# Patient Record
Sex: Male | Born: 1960 | Race: White | Hispanic: No | Marital: Married | Smoking: Former smoker
Health system: Southern US, Community
[De-identification: ages and names within clinical notes are randomized; demographics above are authoritative.]

## PROBLEM LIST (undated history)

## (undated) DIAGNOSIS — E785 Hyperlipidemia, unspecified: Secondary | ICD-10-CM

## (undated) DIAGNOSIS — M2550 Pain in unspecified joint: Secondary | ICD-10-CM

## (undated) DIAGNOSIS — K635 Polyp of colon: Secondary | ICD-10-CM

## (undated) DIAGNOSIS — IMO0002 Reserved for concepts with insufficient information to code with codable children: Secondary | ICD-10-CM

## (undated) HISTORY — DX: Reserved for concepts with insufficient information to code with codable children: IMO0002

## (undated) HISTORY — DX: Polyp of colon: K63.5

## (undated) HISTORY — DX: Pain in unspecified joint: M25.50

## (undated) HISTORY — DX: Hyperlipidemia, unspecified: E78.5

---

## 1998-08-01 ENCOUNTER — Emergency Department (HOSPITAL_COMMUNITY): Admission: EM | Admit: 1998-08-01 | Discharge: 1998-08-01 | Payer: Self-pay | Admitting: Emergency Medicine

## 1999-06-04 ENCOUNTER — Encounter: Admission: RE | Admit: 1999-06-04 | Discharge: 1999-06-13 | Payer: Self-pay | Admitting: Neurosurgery

## 1999-06-07 ENCOUNTER — Ambulatory Visit (HOSPITAL_COMMUNITY): Admission: RE | Admit: 1999-06-07 | Discharge: 1999-06-07 | Payer: Self-pay | Admitting: Neurosurgery

## 2001-07-15 ENCOUNTER — Encounter: Admission: RE | Admit: 2001-07-15 | Discharge: 2001-10-13 | Payer: Self-pay | Admitting: Internal Medicine

## 2001-11-01 ENCOUNTER — Ambulatory Visit (HOSPITAL_COMMUNITY): Admission: RE | Admit: 2001-11-01 | Discharge: 2001-11-01 | Payer: Self-pay | Admitting: Internal Medicine

## 2001-11-01 ENCOUNTER — Encounter: Payer: Self-pay | Admitting: Internal Medicine

## 2002-01-18 ENCOUNTER — Ambulatory Visit (HOSPITAL_COMMUNITY): Admission: RE | Admit: 2002-01-18 | Discharge: 2002-01-18 | Payer: Self-pay | Admitting: Internal Medicine

## 2002-01-18 ENCOUNTER — Encounter: Payer: Self-pay | Admitting: Internal Medicine

## 2003-07-21 ENCOUNTER — Encounter: Payer: Self-pay | Admitting: Family Medicine

## 2003-07-21 ENCOUNTER — Ambulatory Visit (HOSPITAL_COMMUNITY): Admission: RE | Admit: 2003-07-21 | Discharge: 2003-07-21 | Payer: Self-pay | Admitting: Family Medicine

## 2004-08-15 ENCOUNTER — Ambulatory Visit (HOSPITAL_COMMUNITY): Admission: RE | Admit: 2004-08-15 | Discharge: 2004-08-15 | Payer: Self-pay | Admitting: Internal Medicine

## 2004-09-16 ENCOUNTER — Ambulatory Visit (HOSPITAL_COMMUNITY): Admission: RE | Admit: 2004-09-16 | Discharge: 2004-09-16 | Payer: Self-pay | Admitting: Internal Medicine

## 2004-12-14 ENCOUNTER — Emergency Department (HOSPITAL_COMMUNITY): Admission: EM | Admit: 2004-12-14 | Discharge: 2004-12-15 | Payer: Self-pay | Admitting: *Deleted

## 2005-06-18 ENCOUNTER — Ambulatory Visit (HOSPITAL_COMMUNITY): Admission: RE | Admit: 2005-06-18 | Discharge: 2005-06-18 | Payer: Self-pay | Admitting: General Surgery

## 2005-06-18 ENCOUNTER — Encounter (INDEPENDENT_AMBULATORY_CARE_PROVIDER_SITE_OTHER): Payer: Self-pay | Admitting: General Surgery

## 2009-04-18 ENCOUNTER — Encounter: Payer: Self-pay | Admitting: Cardiology

## 2009-04-20 ENCOUNTER — Encounter: Payer: Self-pay | Admitting: Cardiology

## 2009-11-27 ENCOUNTER — Encounter: Payer: Self-pay | Admitting: Cardiology

## 2010-01-31 ENCOUNTER — Encounter: Payer: Self-pay | Admitting: Cardiology

## 2010-02-12 ENCOUNTER — Ambulatory Visit (HOSPITAL_COMMUNITY): Admission: RE | Admit: 2010-02-12 | Discharge: 2010-02-12 | Payer: Self-pay | Admitting: General Surgery

## 2010-02-21 ENCOUNTER — Encounter (INDEPENDENT_AMBULATORY_CARE_PROVIDER_SITE_OTHER): Payer: Self-pay | Admitting: *Deleted

## 2010-02-21 ENCOUNTER — Telehealth (INDEPENDENT_AMBULATORY_CARE_PROVIDER_SITE_OTHER): Payer: Self-pay | Admitting: *Deleted

## 2010-02-21 ENCOUNTER — Ambulatory Visit: Payer: Self-pay | Admitting: Cardiology

## 2010-02-21 DIAGNOSIS — G894 Chronic pain syndrome: Secondary | ICD-10-CM | POA: Insufficient documentation

## 2010-02-21 DIAGNOSIS — I200 Unstable angina: Secondary | ICD-10-CM

## 2010-02-21 DIAGNOSIS — E1165 Type 2 diabetes mellitus with hyperglycemia: Secondary | ICD-10-CM

## 2010-02-21 DIAGNOSIS — IMO0001 Reserved for inherently not codable concepts without codable children: Secondary | ICD-10-CM | POA: Insufficient documentation

## 2010-02-21 DIAGNOSIS — N401 Enlarged prostate with lower urinary tract symptoms: Secondary | ICD-10-CM | POA: Insufficient documentation

## 2010-02-21 DIAGNOSIS — E119 Type 2 diabetes mellitus without complications: Secondary | ICD-10-CM | POA: Insufficient documentation

## 2010-02-21 DIAGNOSIS — E669 Obesity, unspecified: Secondary | ICD-10-CM | POA: Insufficient documentation

## 2010-02-22 ENCOUNTER — Inpatient Hospital Stay (HOSPITAL_BASED_OUTPATIENT_CLINIC_OR_DEPARTMENT_OTHER): Admission: RE | Admit: 2010-02-22 | Discharge: 2010-02-22 | Payer: Self-pay | Admitting: Cardiology

## 2010-02-22 ENCOUNTER — Ambulatory Visit: Payer: Self-pay | Admitting: Cardiology

## 2010-03-01 ENCOUNTER — Telehealth (INDEPENDENT_AMBULATORY_CARE_PROVIDER_SITE_OTHER): Payer: Self-pay | Admitting: *Deleted

## 2010-03-05 ENCOUNTER — Encounter: Payer: Self-pay | Admitting: Cardiology

## 2010-03-12 ENCOUNTER — Ambulatory Visit: Payer: Self-pay | Admitting: Cardiology

## 2011-01-07 NOTE — Letter (Signed)
Summary: Internal Other/ PATIENT HISTORY FORM  Internal Other/ PATIENT HISTORY FORM   Imported By: Dorise Hiss 02/21/2010 11:31:52  _____________________________________________________________________  External Attachment:    Type:   Image     Comment:   External Document

## 2011-01-07 NOTE — Assessment & Plan Note (Signed)
Summary: eph-post cath   Visit Type:  Follow-up Primary Provider:  Dr. Ulis Rias [facility])   History of Present Illness: The patient presents for followup of chest pain. Cardiac catheterization demonstrated normal coronary arteries. Of note he did have an abnormal chest x-ray prior to the procedure. However, a repeat that I reviewed today demonstrated no suggestion of abnormalities. It was most likely a confluence of vascular structures that was being described. Since the catheterization the patient has had no new problems. He's had no further chest pressure, neck or arm discomfort. He said no palpitations, presyncope or syncope. He has not been particularly active. He had no problems with his catheterization site.  Preventive Screening-Counseling & Management  Alcohol-Tobacco     Smoking Status: quit  Comments: smoked for about 20 yrs, quit about 3 yrs ago  Current Medications (verified): 1)  Percocet 10-325 Mg Tabs (Oxycodone-Acetaminophen) .... As Needed Pain 2)  Oxycontin 20 Mg Xr12h-Tab (Oxycodone Hcl) .... Take 1 Tablet By Mouth Three Times A Day 3)  Lisinopril 10 Mg Tabs (Lisinopril) .... Take One Tablet By Mouth Daily 4)  Lipitor 10 Mg Tabs (Atorvastatin Calcium) .... Take One Tablet By Mouth Daily. 5)  Oxycontin 40 Mg Xr12h-Tab (Oxycodone Hcl) .... Take 1 Tablet By Mouth Three Times A Day 6)  Meloxicam 15 Mg Tabs (Meloxicam) .... Take 1 Tablet By Mouth Once A Day 7)  Flomax 0.4 Mg Caps (Tamsulosin Hcl) .... Take 1 Capsule By Mouth Once A Day 8)  Metformin Hcl 850 Mg Tabs (Metformin Hcl) .... Take 1 Tablet By Mouth Two Times A Day 9)  Gabapentin 600 Mg Tabs (Gabapentin) .... Take 2 Tablets By Mouth Two Times A Day 10)  Aspirin 81 Mg Tbec (Aspirin) .... Take One Tablet By Mouth Daily  Allergies (verified): No Known Drug Allergies  Comments:  Nurse/Medical Assistant: The patient's medications were reviewed with the patient and were updated in the Medication  List. Pt did not bring list but states his list hasn't changed from last visit. Cyril Loosen, RN, BSN (March 12, 2010 11:53 AM)  Past History:  Past Medical History: Reviewed history from 02/21/2010 and no changes required. Diabetes Type 2 (for approx. 9 years now) Joint Pain Hyperlipidemia Herniated disc syndrome Colon polyp.   Review of Systems       Positive for palpitations. Otherwise as stated in the history of present illness and unchanged from previous.  Vital Signs:  Patient profile:   50 year old male Height:      72 inches Weight:      275 pounds BMI:     37.43 Pulse rate:   90 / minute BP sitting:   123 / 75  (left arm) Cuff size:   large  Vitals Entered By: Cyril Loosen, RN, BSN (March 12, 2010 11:51 AM)  Nutrition Counseling: Patient's BMI is greater than 25 and therefore counseled on weight management options. Comments Post cath follow up visit   Physical Exam  General:  Well developed, well nourished, in no acute distress. Head:  normocephalic and atraumatic Eyes:  PERRLA/EOM intact; conjunctiva and lids normal. Neck:  Neck supple, no JVD. No masses, thyromegaly or abnormal cervical nodes. Chest Wall:  no deformities or breast masses noted Lungs:  Clear bilaterally to auscultation and percussion. Heart:  Non-displaced PMI, chest non-tender; regular rate and rhythm, S1, S2 without murmurs, rubs or gallops. Carotid upstroke normal, no bruit. Normal abdominal aortic size, no bruits. Femorals normal pulses, no bruits. Pedals  normal pulses. No edema, no varicosities. Abdomen:  Bowel sounds positive; abdomen soft and non-tender without masses, organomegaly, or hernias noted. No hepatosplenomegaly. Msk:  Back normal, normal gait. Muscle strength and tone normal. Extremities:  No clubbing or cyanosis. Neurologic:  Alert and oriented x 3. Psych:  Normal affect.   Impression & Recommendations:  Problem # 1:  UNSTABLE ANGINA (ICD-411.1) The patient's chest  pain does not appear to have a cardiac etiology. No further workup is planned.  Problem # 2:  OBESITY, UNSPECIFIED (ICD-278.00) He does understand the need to lose weight with diet and exercise.  Problem # 3:  DIAB W/O MENTION COMP TYPE II/UNS TYPE UNCNTRL (ICD-250.02) This will be followed by his primary physician.  Patient Instructions: 1)  Your physician recommends that you continue on your current medications as directed. Please refer to the Current Medication list given to you today. 2)  No follow up needed.

## 2011-01-07 NOTE — Progress Notes (Signed)
Summary: NEED REPEAT CXR PA AND LATERAL  Phone Note Outgoing Call Call back at [facility] Phone [phone]   Call placed by: Carlye Grippe,  March 01, 2010 11:17 AM Call placed to: Patient Summary of Call: left message on machine to call office r/e need to get repeat PA and Lateral CXR per MD.  Initial call taken by: Carlye Grippe,  March 01, 2010 11:18 AM  Follow-up for Phone Call        Patient informed of the above.  Follow-up by: Carlye Grippe,  March 04, 2010 8:38 AM

## 2011-01-07 NOTE — Assessment & Plan Note (Signed)
Summary: NP-ANGINA -DIABETES MELLITUS TYPE II   Visit Type:  Initial Consult Primary Provider:  Dr. Ulis Rias [facility])  CC:  chest pain.  History of Present Illness: The patient has no prior cardiac history. He presents for evaluation of chest discomfort in the face of multiple cardiovascular risk factors. He did have a cardiac catheterization perhaps 10 years ago for vague symptoms. He doesn't think there was any blockage at that time. However, he now presents for evaluation of her exertional chest discomfort that is new onset. He likes to rabbit hunt.  Though he is limited by back pain e has been able to do this as recently as January without significant limitations. However, in [place] 2 separate occasionswhile doing his usual walking in the [place] developed chest pressure. It was substernal and it was associated with left arm tightness. He was short of breath. He was more diaphoretic than usual. Both episodes were the same lasting 10-15 minutesafter he stopped. Following this event he has done nothing else exerting. He has been afraid to go hunting.He has felt more fatigued however than usual. He has had no recurrence of the discomfort. He's not had any resting complaints such as PND or orthopnea.He's had no palpitations, presyncope or syncope.  Preventive Screening-Counseling & Management  Alcohol-Tobacco     Smoking Status: quit  Comments: quit smoking 3 yrs ago smoked for about 30 yrs  Current Medications (verified): 1)  Percocet 10-325 Mg Tabs (Oxycodone-Acetaminophen) .... As Needed Pain 2)  Oxycontin 20 Mg Xr12h-Tab (Oxycodone Hcl) .... Take 1 Tablet By Mouth Three Times A Day 3)  Lisinopril 10 Mg Tabs (Lisinopril) .... Take One Tablet By Mouth Daily 4)  Lipitor 10 Mg Tabs (Atorvastatin Calcium) .... Take One Tablet By Mouth Daily. 5)  Oxycontin 40 Mg Xr12h-Tab (Oxycodone Hcl) .... Take 1 Tablet By Mouth Three Times A Day 6)  Meloxicam 15 Mg Tabs (Meloxicam)  .... Take 1 Tablet By Mouth Once A Day 7)  Flomax 0.4 Mg Caps (Tamsulosin Hcl) .... Take 1 Capsule By Mouth Once A Day 8)  Metformin Hcl 850 Mg Tabs (Metformin Hcl) .... Take 1 Tablet By Mouth Two Times A Day 9)  Gabapentin 600 Mg Tabs (Gabapentin) .... Take 2 Tablets By Mouth Two Times A Day 10)  Aspirin 81 Mg Tbec (Aspirin) .... Take One Tablet By Mouth Daily  Allergies: No Known Drug Allergies  Comments:  Nurse/Medical Assistant: The patient's medications were reviewed with the patient and were updated in the Medication List. Pt brought medication bottles to office visit.  Cyril Loosen, RN, BSN (February 21, 2010 11:56 AM)  Past History:  Past Medical History: Diabetes Type 2 (for approx. 9 years now) Joint Pain Hyperlipidemia Herniated disc syndrome Colon polyp.   Past Surgical History: None  Family History: Mother had heart attack in her 3's Father no early heart disease. Brother with history of ocaine use, died age 79 of an MI Family history of diabetes  Social History: Disabled  Married, 1 stepson Rare use of alcohol Regular Exercise - no Quit tobacco 3 years ago (25 years 2 1/2 ppd at the peak) Smoking Status:  quit  Review of Systems       As stated in the HPI and negative for all other systems.   Vital Signs:  Patient profile:   50 year old male Height:      72 inches Weight:      273.25 pounds BMI:     37.19 Pulse rate:  96 / minute BP sitting:   113 / 68  (left arm) Cuff size:   large  Vitals Entered By: Cyril Loosen, RN, BSN (February 21, 2010 11:50 AM)  Nutrition Counseling: Patient's BMI is greater than 25 and therefore counseled on weight management options. CC: chest pain Comments Uncomfortable feeling in chest, family h/o CAD   Physical Exam  General:  Well developed, well nourished, in no acute distress. Head:  normocephalic and atraumatic Eyes:  PERRLA/EOM intact; conjunctiva and lids normal. Mouth:  Teeth, gums and palate  normal. Oral mucosa normal. Neck:  Neck supple, no JVD. No masses, thyromegaly or abnormal cervical nodes. Chest Wall:  no deformities or breast masses noted Lungs:  Clear bilaterally to auscultation and percussion. Abdomen:  Bowel sounds positive; abdomen soft and non-tender without masses, organomegaly, or hernias noted. No hepatosplenomegaly, obese Msk:  Back normal, normal gait. Muscle strength and tone normal. Extremities:  No clubbing or cyanosis. Neurologic:  Alert and oriented x 3. Skin:  Intact without lesions or rashes. Cervical Nodes:  no significant adenopathy Axillary Nodes:  no significant adenopathy Inguinal Nodes:  no significant adenopathy Psych:  Normal affect.   Detailed Cardiovascular Exam  Neck    Carotids: Carotids full and equal bilaterally without bruits.      Neck Veins: Normal, no JVD.    Heart    Inspection: no deformities or lifts noted.      Palpation: normal PMI with no thrills palpable.      Auscultation: regular rate and rhythm, S1, S2 without murmurs, rubs, gallops, or clicks.    Vascular    Abdominal Aorta: no palpable masses, pulsations, or audible bruits.      Femoral Pulses: normal femoral pulses bilaterally.      Pedal Pulses: normal pedal pulses bilaterally.      Radial Pulses: normal radial pulses bilaterally.      Peripheral Circulation: no clubbing, cyanosis, or edema noted with normal capillary refill.     EKG  Procedure date:  02/21/2010  Findings:      sinus rhythm with premature atrial contractions, rate 85, axis within normal limits, intervals within normal limits, no acute ST-T wave changes  Impression & Recommendations:  Problem # 1:  UNSTABLE ANGINA (ICD-411.1) The patient has new onset exertional angina which constitutes unstable angina.It is a classic description. He has multiple cardiovascular risk factors.The pretest probability of obstructive coronary disease is extremely high. Therefore, according to appropriateness  guidelinesthe most appropriate test is cardiac catheterization. I have discussed at length with the patient the rocedure. He has had it beforeabout a decade ago. We discussed risks and benefits to include stroke, death, heart attack,embolism, vascular trauma, bleeding, bruising, nfection and other. He understands and agrees to proceed.  Problem # 2:  DIAB W/O MENTION COMP TYPE II/UNS TYPE UNCNTRL (ICD-250.02) His iabetes has not been well controlled by his report. He understands the importance of followup with his physician and tight control. He says it is getting better recently.  Problem # 3:  OBESITY, UNSPECIFIED (ICD-278.00) I have discussed with the patientthe importance of weight loss and we'll reemphasize this at future visits. When I do his catheterization I will also give him specific instruction on diet and exercise knowing that he is limited by back pain.  Other Orders: EKG w/ Interpretation (93000) Cardiac Catheterization (Cardiac Cath) T-Basic Metabolic Panel ([id number]) T-CBC No Diff ([id number]) T-Protime, Auto ([id number]) T-PTT ([id number]) T-Chest x-ray, 2 views (66440)  Patient Instructions: 1)  Your physician has requested that  you have a cardiac catheterization.  Cardiac catheterization is used to diagnose and/or treat various heart conditions. Doctors may recommend this procedure for a number of different reasons. The most common reason is to evaluate chest pain. Chest pain can be a symptom of coronary artery disease (CAD), and cardiac catheterization can show whether plaque is narrowing or blocking your heart's arteries. This procedure is also used to evaluate the valves, as well as measure the blood flow and oxygen levels in different parts of your heart.  For further information please visit [email or web address]  Please follow instruction sheet, as given.  Appended Document: NP-ANGINA -DIABETES MELLITUS TYPE II faxed to [phone] PCP Ulis Rias.

## 2011-01-07 NOTE — Letter (Signed)
Summary: [facility] D/C DR. Wende Crease  [facility] D/C DR. Wende Crease   Imported By: Zachary George 02/21/2010 10:53:47  _____________________________________________________________________  External Attachment:    Type:   Image     Comment:   External Document

## 2011-01-07 NOTE — Letter (Signed)
Summary: External Correspondence/ FAXED PRE-CATH ORDER  External Correspondence/ FAXED PRE-CATH ORDER   Imported By: Dorise Hiss 02/22/2010 11:52:46  _____________________________________________________________________  External Attachment:    Type:   Image     Comment:   External Document

## 2011-01-07 NOTE — Progress Notes (Signed)
Summary: VERBAL CHEST XRAY RESULT  Phone Note From Other Clinic   Caller: Dara Call For: nurse Summary of Call: precath chest xray call report of chest xray includes slightly lobulated possible lateral contour of the cardiac sihlouette on the left but this may reflect superimposition of structures. no pulmonary edema,pneumonia, or pleural effusion. Fax result to follow. Initial call taken by: Carlye Grippe,  February 21, 2010 2:37 PM     Appended Document: VERBAL CHEST XRAY RESULT I will obtain a repeat PA and lateral CXR  Appended Document: VERBAL CHEST XRAY RESULT Patient informed of the above.order faxed to [facility].

## 2011-01-07 NOTE — Letter (Signed)
Summary: External Correspondence/ PROGRESS NOTES DR. Melvyn Neth  External Correspondence/ PROGRESS NOTES DR. Melvyn Neth   Imported By: Dorise Hiss 02/20/2010 16:19:23  _____________________________________________________________________  External Attachment:    Type:   Image     Comment:   External Document

## 2011-01-07 NOTE — Letter (Signed)
Summary: Cardiac Cath Instructions - [facility] Lab  [facility] [address]   [place], [place] [address]   Phone: [phone]  Fax: [phone]     02/21/2010 MRN: [id number]  [facility] [address] [place], [place]  [address]  Dear Mr. Beadnell,   You are scheduled for a Cardiac Catheterization on 02/22/10 with Dr. Antoine Poche.  Please arrive to the 1st floor of the Heart and Vascular Center at [facility] at 11:00am on the day of your procedure. Please do not arrive before 6:30 a.m. Call the Heart and Vascular Center at [phone] if you are unable to make your appointmnet. The Code to get into the parking garage under the building is 9000. Take the elevators to the 1st floor. You must have someone to drive you home. Someone must be with you for the first 24 hours after you arrive home. Please wear clothes that are easy to get on and off and wear slip-on shoes. Do not eat or drink after midnight except water with your medications that morning. Bring all your medications and current insurance cards with you.  _X__ Make sure you take your aspirin 325mg .  _X__ You may take ALL of your medications with water that morning.  The usual length of stay after your procedure is 2 to 3 hours. This can vary.  If you have any questions, please call the office at the number listed above.  Carlye Grippe    Directions to the [employer] [facility]  Please Note : Park in [place] under the building not the parking deck.  From [employer]: Turn onto [address] Left onto [place] [place] (1st stoplight) Right at the brick entrance to the hospital ([address] circle [address]) Bear to the right and you will see a blue sign "Heart and Vascular Center" Parking garage is a sharp right'to get through the gate out in the code 9000. Once you park, take the elevator to the first floor. Please do not arrive before 0630am. The building will be dark  before that time.   From [address] Turn onto [address] Turn left into the brick entrance to the hospital (Main circle drive) Bear to the right and you will see a blue sign "[facility] Center" Parking garage is a sharp right, to get thru the gate put in the code 9000. Once you park, take the elevator to the first floor. Please do not arrive before 0630am. The building will be dark before that time

## 2011-03-03 LAB — POCT I-STAT GLUCOSE: Glucose, Bld: 155 mg/dL — ABNORMAL HIGH (ref 70–99)

## 2011-04-25 NOTE — H&P (Signed)
NAME:  Eddie Irwin, Eddie Irwin             ACCOUNT NO.:  [id number]   MEDICAL RECORD NO.:  [id number]          PATIENT TYPE:  AMB   LOCATION:                                FACILITY:  [facility]   PHYSICIAN:  Dalia Heading, M.D.  DATE OF BIRTH:  [birth year]   DATE OF ADMISSION:  DATE OF DISCHARGE:  LH                                HISTORY & PHYSICAL   CHIEF COMPLAINT:  Hematochezia.   HISTORY OF PRESENT ILLNESS:  The patient is a 50 year old white male who is  referred for endoscopic evaluation.  He needs a colonoscopy for  hematochezia.  He has had hematochezia with nonspecific abdominal cramping  and loose stools over the past few months.  No weight loss, nausea,  vomiting, constipation, melena, had been noted.  He has never had a  colonoscopy.  There is no family history of colon carcinoma.  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   PAST MEDICAL HISTORY:  1.  Back problems.  2.  Non-insulin-dependent diabetes mellitus.   PAST SURGICAL HISTORY:  Noncontributory.   CURRENT MEDICATIONS:  1.  Avandia.  2. Glucophage.  3. Neurontin.  4. Flomax.  5. Lipitor.  6.      Glipizide.  7. OxyContin.  8. Percocet.   ALLERGIES:  CODEINE.   REVIEW OF SYSTEMS:  Noncontributory.   PHYSICAL EXAMINATION:  GENERAL:  The patient is a well-developed, well-  nourished white male in no acute distress.  VITAL SIGNS:  He is afebrile and vital signs are stable.  LUNGS:  Clear to auscultation with equal breath sounds bilaterally.  HEART:  Heart examination reveals a regular rate and rhythm without S3, S4  or murmurs.  ABDOMEN:  Soft, nontender, nondistended.  No hepatosplenomegaly or masses  are noted.  RECTAL:  Examination was deferred to the procedure.   IMPRESSION:  1.  Hematochezia.  2.  Diarrhea.  3.  Abdominal pain.   PLAN:  The patient is scheduled for a colonoscopy on June 18, 2005.  The  risks and benefits of the procedure, including bleeding and perforation were  fully explained to  the patient.  Gave informed consent.       MAJ/MEDQ  D:  06/05/2005  T:  06/05/2005  Job:  [id number]   cc:   Madelin Rear. Sherwood Gambler, MD  [address]  [place]  [place] [address]  Fax: [phone]

## 2012-07-05 ENCOUNTER — Encounter: Payer: Self-pay | Admitting: Cardiology

## 2019-12-13 ENCOUNTER — Inpatient Hospital Stay (HOSPITAL_COMMUNITY)
Admission: EM | Admit: 2019-12-13 | Discharge: 2019-12-19 | DRG: 177 | Disposition: A | Discharge status: Home / Self Care | Payer: Medicare Other | Attending: Internal Medicine | Admitting: Internal Medicine

## 2019-12-13 ENCOUNTER — Other Ambulatory Visit: Payer: Self-pay

## 2019-12-13 ENCOUNTER — Emergency Department (HOSPITAL_COMMUNITY): Payer: Medicare Other

## 2019-12-13 ENCOUNTER — Encounter (HOSPITAL_COMMUNITY): Payer: Self-pay

## 2019-12-13 DIAGNOSIS — E1110 Type 2 diabetes mellitus with ketoacidosis without coma: Secondary | ICD-10-CM

## 2019-12-13 DIAGNOSIS — I10 Essential (primary) hypertension: Secondary | ICD-10-CM

## 2019-12-13 DIAGNOSIS — Z7982 Long term (current) use of aspirin: Secondary | ICD-10-CM

## 2019-12-13 DIAGNOSIS — N179 Acute kidney failure, unspecified: Secondary | ICD-10-CM | POA: Diagnosis present

## 2019-12-13 DIAGNOSIS — U071 COVID-19: Principal | ICD-10-CM

## 2019-12-13 DIAGNOSIS — E785 Hyperlipidemia, unspecified: Secondary | ICD-10-CM | POA: Diagnosis not present

## 2019-12-13 DIAGNOSIS — Z8249 Family history of ischemic heart disease and other diseases of the circulatory system: Secondary | ICD-10-CM

## 2019-12-13 DIAGNOSIS — E669 Obesity, unspecified: Secondary | ICD-10-CM | POA: Diagnosis present

## 2019-12-13 DIAGNOSIS — Z8719 Personal history of other diseases of the digestive system: Secondary | ICD-10-CM

## 2019-12-13 DIAGNOSIS — G629 Polyneuropathy, unspecified: Secondary | ICD-10-CM | POA: Diagnosis present

## 2019-12-13 DIAGNOSIS — G894 Chronic pain syndrome: Secondary | ICD-10-CM | POA: Diagnosis present

## 2019-12-13 DIAGNOSIS — J1282 Pneumonia due to coronavirus disease 2019: Secondary | ICD-10-CM | POA: Diagnosis present

## 2019-12-13 DIAGNOSIS — Z79899 Other long term (current) drug therapy: Secondary | ICD-10-CM | POA: Diagnosis not present

## 2019-12-13 DIAGNOSIS — E0810 Diabetes mellitus due to underlying condition with ketoacidosis without coma: Secondary | ICD-10-CM

## 2019-12-13 DIAGNOSIS — R0602 Shortness of breath: Secondary | ICD-10-CM

## 2019-12-13 DIAGNOSIS — Z6841 Body Mass Index (BMI) 40.0 and over, adult: Secondary | ICD-10-CM | POA: Diagnosis not present

## 2019-12-13 DIAGNOSIS — N400 Enlarged prostate without lower urinary tract symptoms: Secondary | ICD-10-CM | POA: Diagnosis present

## 2019-12-13 DIAGNOSIS — T383X6A Underdosing of insulin and oral hypoglycemic [antidiabetic] drugs, initial encounter: Secondary | ICD-10-CM | POA: Diagnosis present

## 2019-12-13 DIAGNOSIS — J9601 Acute respiratory failure with hypoxia: Secondary | ICD-10-CM | POA: Diagnosis not present

## 2019-12-13 DIAGNOSIS — Z87891 Personal history of nicotine dependence: Secondary | ICD-10-CM

## 2019-12-13 LAB — BASIC METABOLIC PANEL
Anion gap: >20 — ABNORMAL HIGH (ref 5–15)
BUN: 31 mg/dL — ABNORMAL HIGH (ref 6–20)
CO2: 11 mmol/L — ABNORMAL LOW (ref 22–32)
Calcium: 8.4 mg/dL — ABNORMAL LOW (ref 8.9–10.3)
Chloride: 91 mmol/L — ABNORMAL LOW (ref 98–111)
Creatinine, Ser: 1.81 mg/dL — ABNORMAL HIGH (ref 0.61–1.24)
GFR calc Af Amer: 46 mL/min — ABNORMAL LOW (ref >60)
GFR calc non Af Amer: 40 mL/min — ABNORMAL LOW (ref >60)
Glucose, Bld: 320 mg/dL — ABNORMAL HIGH (ref 70–99)
Potassium: 5.1 mmol/L (ref 3.5–5.1)
Sodium: 130 mmol/L — ABNORMAL LOW (ref 135–145)

## 2019-12-13 LAB — CBC WITH DIFFERENTIAL/PLATELET
Abs Immature Granulocytes: 0.23 10*3/uL — ABNORMAL HIGH (ref 0.00–0.07)
Basophils Absolute: 0 10*3/uL (ref 0.0–0.1)
Basophils Relative: 0 %
Eosinophils Absolute: 0 10*3/uL (ref 0.0–0.5)
Eosinophils Relative: 0 %
HCT: 45.3 % (ref 39.0–52.0)
Hemoglobin: 14 g/dL (ref 13.0–17.0)
Immature Granulocytes: 3 %
Lymphocytes Relative: 9 %
Lymphs Abs: 0.7 10*3/uL (ref 0.7–4.0)
MCH: 29.9 pg (ref 26.0–34.0)
MCHC: 30.9 g/dL (ref 30.0–36.0)
MCV: 96.8 fL (ref 80.0–100.0)
Monocytes Absolute: 0.5 10*3/uL (ref 0.1–1.0)
Monocytes Relative: 7 %
Neutro Abs: 5.9 10*3/uL (ref 1.7–7.7)
Neutrophils Relative %: 81 %
Platelets: 202 10*3/uL (ref 150–400)
RBC: 4.68 MIL/uL (ref 4.22–5.81)
RDW: 12.5 % (ref 11.5–15.5)
WBC: 7.3 10*3/uL (ref 4.0–10.5)
nRBC: 0 % (ref 0.0–0.2)

## 2019-12-13 LAB — BLOOD GAS, VENOUS
Acid-base deficit: 17.4 mmol/L — ABNORMAL HIGH (ref 0.0–2.0)
Bicarbonate: 11.3 mmol/L — ABNORMAL LOW (ref 20.0–28.0)
FIO2: 96
O2 Saturation: 87.1 %
Patient temperature: 37
pCO2, Ven: 27.5 mmHg — ABNORMAL LOW (ref 44.0–60.0)
pH, Ven: 7.166 — CL (ref 7.250–7.430)
pO2, Ven: 62.9 mmHg — ABNORMAL HIGH (ref 32.0–45.0)

## 2019-12-13 LAB — TRIGLYCERIDES: Triglycerides: 404 mg/dL — ABNORMAL HIGH (ref <150)

## 2019-12-13 LAB — URINALYSIS, ROUTINE W REFLEX MICROSCOPIC
Bacteria, UA: NONE SEEN
Bilirubin Urine: NEGATIVE
Glucose, UA: >=500 mg/dL — AB
Hgb urine dipstick: NEGATIVE
Ketones, ur: 80 mg/dL — AB
Leukocytes,Ua: NEGATIVE
Nitrite: NEGATIVE
Protein, ur: NEGATIVE mg/dL
Specific Gravity, Urine: 1.017 (ref 1.005–1.030)
pH: 5 (ref 5.0–8.0)

## 2019-12-13 LAB — PROCALCITONIN: Procalcitonin: 0.11 ng/mL

## 2019-12-13 LAB — D-DIMER, QUANTITATIVE: D-Dimer, Quant: 0.89 ug/mL-FEU — ABNORMAL HIGH (ref 0.00–0.50)

## 2019-12-13 LAB — FERRITIN: Ferritin: 474 ng/mL — ABNORMAL HIGH (ref 24–336)

## 2019-12-13 LAB — CBG MONITORING, ED: Glucose-Capillary: 309 mg/dL — ABNORMAL HIGH (ref 70–99)

## 2019-12-13 LAB — LACTIC ACID, PLASMA: Lactic Acid, Venous: 2.1 mmol/L (ref 0.5–1.9)

## 2019-12-13 LAB — LACTATE DEHYDROGENASE: LDH: 263 U/L — ABNORMAL HIGH (ref 98–192)

## 2019-12-13 LAB — FIBRINOGEN: Fibrinogen: 669 mg/dL — ABNORMAL HIGH (ref 210–475)

## 2019-12-13 LAB — C-REACTIVE PROTEIN: CRP: 19.4 mg/dL — ABNORMAL HIGH (ref <1.0)

## 2019-12-13 LAB — BETA-HYDROXYBUTYRIC ACID: Beta-Hydroxybutyric Acid: 11.65 mmol/L — ABNORMAL HIGH (ref 0.05–0.27)

## 2019-12-13 MED ORDER — FENOFIBRATE 160 MG PO TABS
160.0000 mg | ORAL_TABLET | Freq: Every day | ORAL | Status: DC
  Filled 2019-12-13 (×3): qty 1

## 2019-12-13 MED ORDER — DEXTROSE-NACL 5-0.45 % IV SOLN: INTRAVENOUS | Status: DC

## 2019-12-13 MED ORDER — AEROCHAMBER Z-STAT PLUS/MEDIUM MISC
1.0000 | Freq: Once | Status: AC
  Administered 2019-12-13: 20:00:00 1

## 2019-12-13 MED ORDER — SODIUM CHLORIDE 0.9 % IV BOLUS
1000.0000 mL | INTRAVENOUS | Status: AC
  Administered 2019-12-13 (×2): 1000 mL via INTRAVENOUS

## 2019-12-13 MED ORDER — DEXTROSE 50 % IV SOLN: 0.0000 mL | INTRAVENOUS | Status: DC | PRN

## 2019-12-13 MED ORDER — SODIUM CHLORIDE 0.9 % IV BOLUS
1000.0000 mL | Freq: Once | INTRAVENOUS | Status: AC
  Administered 2019-12-13: 23:00:00 1000 mL via INTRAVENOUS

## 2019-12-13 MED ORDER — SODIUM CHLORIDE 0.9 % IV SOLN: INTRAVENOUS | Status: DC

## 2019-12-13 MED ORDER — GUAIFENESIN-DM 100-10 MG/5ML PO SYRP
10.0000 mL | ORAL_SOLUTION | ORAL | Status: DC | PRN
  Administered 2019-12-16 – 2019-12-17 (×3): 10 mL via ORAL
  Filled 2019-12-13 (×3): qty 10

## 2019-12-13 MED ORDER — TAMSULOSIN HCL 0.4 MG PO CAPS
0.4000 mg | ORAL_CAPSULE | Freq: Every day | ORAL | Status: DC
  Administered 2019-12-14 – 2019-12-19 (×6): 0.4 mg via ORAL
  Filled 2019-12-13 (×6): qty 1

## 2019-12-13 MED ORDER — POTASSIUM CHLORIDE 10 MEQ/100ML IV SOLN: 10.0000 meq | INTRAVENOUS | Status: AC

## 2019-12-13 MED ORDER — HYDROCOD POLST-CPM POLST ER 10-8 MG/5ML PO SUER
5.0000 mL | Freq: Two times a day (BID) | ORAL | Status: DC | PRN
  Administered 2019-12-14 – 2019-12-18 (×2): 5 mL via ORAL
  Filled 2019-12-13 (×2): qty 5

## 2019-12-13 MED ORDER — ALBUTEROL SULFATE HFA 108 (90 BASE) MCG/ACT IN AERS
4.0000 | INHALATION_SPRAY | Freq: Once | RESPIRATORY_TRACT | Status: AC
  Administered 2019-12-13: 20:00:00 4 via RESPIRATORY_TRACT
  Filled 2019-12-13: qty 6.7

## 2019-12-13 MED ORDER — TOPIRAMATE 25 MG PO TABS
25.0000 mg | ORAL_TABLET | Freq: Two times a day (BID) | ORAL | Status: DC
  Administered 2019-12-14 – 2019-12-19 (×11): 25 mg via ORAL
  Filled 2019-12-13 (×11): qty 1

## 2019-12-13 MED ORDER — ALBUTEROL SULFATE HFA 108 (90 BASE) MCG/ACT IN AERS
2.0000 | INHALATION_SPRAY | RESPIRATORY_TRACT | Status: DC | PRN
  Administered 2019-12-16: 08:00:00 2 via RESPIRATORY_TRACT

## 2019-12-13 MED ORDER — GABAPENTIN 300 MG PO CAPS
600.0000 mg | ORAL_CAPSULE | Freq: Two times a day (BID) | ORAL | Status: DC
  Administered 2019-12-14 – 2019-12-19 (×12): 600 mg via ORAL
  Filled 2019-12-13 (×12): qty 2

## 2019-12-13 MED ORDER — ASPIRIN EC 81 MG PO TBEC
81.0000 mg | DELAYED_RELEASE_TABLET | Freq: Every day | ORAL | Status: DC
  Administered 2019-12-14 – 2019-12-19 (×6): 81 mg via ORAL
  Filled 2019-12-13 (×6): qty 1

## 2019-12-13 MED ORDER — ZINC SULFATE 220 (50 ZN) MG PO CAPS
220.0000 mg | ORAL_CAPSULE | Freq: Every day | ORAL | Status: DC
  Administered 2019-12-14 – 2019-12-19 (×6): 220 mg via ORAL
  Filled 2019-12-13 (×6): qty 1

## 2019-12-13 MED ORDER — ATORVASTATIN CALCIUM 10 MG PO TABS
10.0000 mg | ORAL_TABLET | Freq: Every day | ORAL | Status: DC
  Administered 2019-12-14 – 2019-12-19 (×6): 10 mg via ORAL
  Filled 2019-12-13 (×6): qty 1

## 2019-12-13 MED ORDER — HEPARIN SODIUM (PORCINE) 5000 UNIT/ML IJ SOLN
5000.0000 [IU] | Freq: Three times a day (TID) | INTRAMUSCULAR | Status: DC
  Administered 2019-12-15 – 2019-12-19 (×12): 5000 [IU] via SUBCUTANEOUS
  Filled 2019-12-13 (×12): qty 1

## 2019-12-13 MED ORDER — INSULIN REGULAR(HUMAN) IN NACL 100-0.9 UT/100ML-% IV SOLN
INTRAVENOUS | Status: DC
  Filled 2019-12-13: qty 100

## 2019-12-13 MED ORDER — ASCORBIC ACID 500 MG PO TABS
500.0000 mg | ORAL_TABLET | Freq: Every day | ORAL | Status: DC
  Administered 2019-12-14 – 2019-12-19 (×6): 500 mg via ORAL
  Filled 2019-12-13 (×6): qty 1

## 2019-12-13 MED ORDER — INSULIN REGULAR(HUMAN) IN NACL 100-0.9 UT/100ML-% IV SOLN
INTRAVENOUS | Status: DC
  Administered 2019-12-13: 23:00:00 13 [IU]/h via INTRAVENOUS
  Filled 2019-12-13: qty 100

## 2019-12-13 NOTE — ED Triage Notes (Signed)
Pt lives at a family members house. family called for SOB  x 2 days.  Covid + 10 days ago.

## 2019-12-13 NOTE — ED Provider Notes (Signed)
[facility] EMERGENCY DEPARTMENT Provider Note   CSN: [id number] Arrival date & time: 12/13/19  1930     History Chief Complaint  Patient presents with  . Shortness of Breath  . Weakness  . COVID+    Eddie Irwin is a 59 y.o. male.  Patient complains of weakness and shortness of breath.  Patient has diabetes and is Covid positive  The history is provided by the patient. No language interpreter was used.  Shortness of Breath Severity:  Mild Onset quality:  Gradual Timing:  Constant Progression:  Waxing and waning Chronicity:  Recurrent Context: not activity   Relieved by:  Nothing Worsened by:  Nothing Associated symptoms: no abdominal pain, no chest pain, no cough, no headaches and no rash   Weakness Associated symptoms: shortness of breath   Associated symptoms: no abdominal pain, no chest pain, no cough, no diarrhea, no frequency, no headaches and no seizures        Past Medical History:  Diagnosis Date  . Colon polyp   . Diabetes mellitus   . Herniated disc   . Hyperlipidemia   . Joint pain     Patient Active Problem List   Diagnosis Date Noted  . COVID-19 virus infection 12/13/2019  . DKA (diabetic ketoacidoses) ([place]) 12/13/2019  . HTN (hypertension) 12/13/2019  . HLD (hyperlipidemia) 12/13/2019  . DIAB W/O MENTION COMP TYPE II/UNS TYPE UNCNTRL 02/21/2010  . Obesity, unspecified 02/21/2010  . Chronic pain syndrome 02/21/2010  . UNSTABLE ANGINA 02/21/2010  . HYPERTROPHY PROSTATE W/UR OBST & OTH LUTS 02/21/2010  . UNSPECIFIED MYALGIA AND MYOSITIS 02/21/2010    History reviewed. No pertinent surgical history.     Family History  Problem Relation Age of Onset  . Heart attack Brother 49       cocaine use  . Heart attack Mother 71    Social History   Tobacco Use  . Smoking status: Former Research scientist (life sciences)  . Smokeless tobacco: Never Used  Substance Use Topics  . Alcohol use: Yes    Comment: RARE  . Drug use: Never    Home Medications Prior to  Admission medications   Medication Sig Start Date End Date Taking? Authorizing Provider  atorvastatin (LIPITOR) 10 MG tablet Take 10 mg by mouth daily.   Yes [provider]  diclofenac (VOLTAREN) 75 MG EC tablet Take 75 mg by mouth 2 (two) times daily. 12/07/19  Yes [provider]  fenofibrate (TRICOR) 145 MG tablet Take 145 mg by mouth daily. 12/07/19  Yes [provider]  fluticasone (FLONASE) 50 MCG/ACT nasal spray Place into both nostrils daily.  12/07/19  Yes [provider]  gabapentin (NEURONTIN) 600 MG tablet Take 1,200 mg by mouth 2 (two) times daily.   Yes [provider]  HUMALOG KWIKPEN 100 UNIT/ML KwikPen  12/07/19  Yes [provider]  LANTUS SOLOSTAR 100 UNIT/ML Solostar Pen  12/07/19  Yes [provider]  lisinopril (PRINIVIL,ZESTRIL) 10 MG tablet Take 10 mg by mouth daily.   Yes [provider]  oxyCODONE (ROXICODONE) 15 MG immediate release tablet Take 15 mg by mouth every 4 (four) hours. 12/06/19  Yes [provider]  SYNJARDY 12.04-999 MG TABS Take 1 tablet by mouth 2 (two) times daily. 12/07/19  Yes [provider]  Tamsulosin HCl (FLOMAX) 0.4 MG CAPS Take 0.4 mg by mouth daily.   Yes [provider]  topiramate (TOPAMAX) 25 MG tablet Take 25 mg by mouth 2 (two) times daily. 12/07/19  Yes  [provider]  aspirin 81 MG tablet Take 81 mg by mouth daily.    [provider]    Allergies    Patient has no allergy information on record.  Review of Systems   Review of Systems  Constitutional: Negative for appetite change and fatigue.  HENT: Negative for congestion, ear discharge and sinus pressure.   Eyes: Negative for discharge.  Respiratory: Positive for shortness of breath. Negative for cough.   Cardiovascular: Negative for chest pain.  Gastrointestinal: Negative for abdominal pain and diarrhea.  Genitourinary: Negative for frequency and hematuria.   Musculoskeletal: Negative for back pain.  Skin: Negative for rash.  Neurological: Positive for weakness. Negative for seizures and headaches.  Psychiatric/Behavioral: Negative for hallucinations.    Physical Exam Updated Vital Signs BP 123/65 (BP Location: Right Arm)   Pulse (!) 114   Temp 98.6 F (37 C) (Oral)   Resp 18   Ht 6' (1.829 m)   Wt 133.8 kg   SpO2 92%   BMI 40.01 kg/m   Physical Exam Vitals and nursing note reviewed.  Constitutional:      Appearance: He is well-developed.  HENT:     Head: Normocephalic.     Nose: Nose normal.  Eyes:     General: No scleral icterus.    Conjunctiva/sclera: Conjunctivae normal.  Neck:     Thyroid: No thyromegaly.  Cardiovascular:     Rate and Rhythm: Normal rate and regular rhythm.     Heart sounds: No murmur. No friction rub. No gallop.   Pulmonary:     Breath sounds: No stridor. No wheezing or rales.  Chest:     Chest wall: No tenderness.  Abdominal:     General: There is no distension.     Tenderness: There is no abdominal tenderness. There is no rebound.  Musculoskeletal:        General: Normal range of motion.     Cervical back: Neck supple.  Lymphadenopathy:     Cervical: No cervical adenopathy.  Skin:    Findings: No erythema or rash.  Neurological:     Mental Status: He is alert and oriented to person, place, and time.     Motor: No abnormal muscle tone.     Coordination: Coordination normal.  Psychiatric:        Behavior: Behavior normal.     ED Results / Procedures / Treatments   Labs (all labs ordered are listed, but only abnormal results are displayed) Labs Reviewed  CBC WITH DIFFERENTIAL/PLATELET - Abnormal; Notable for the following components:      Result Value   Abs Immature Granulocytes 0.23 (*)    All other components within normal limits  BASIC METABOLIC PANEL - Abnormal; Notable for the following components:   Sodium 130 (*)    Chloride 91 (*)    CO2 11 (*)    Glucose, Bld 320 (*)     BUN 31 (*)    Creatinine, Ser 1.81 (*)    Calcium 8.4 (*)    GFR calc non Af Amer 40 (*)    GFR calc Af Amer 46 (*)    Anion gap >20 (*)    All other components within normal limits  BETA-HYDROXYBUTYRIC ACID  BETA-HYDROXYBUTYRIC ACID  URINALYSIS, ROUTINE W REFLEX MICROSCOPIC  BLOOD GAS, VENOUS  LACTIC ACID, PLASMA  D-DIMER, QUANTITATIVE (NOT AT [facility])  PROCALCITONIN  LACTATE DEHYDROGENASE  FERRITIN  TRIGLYCERIDES  FIBRINOGEN  C-REACTIVE PROTEIN  CBG MONITORING, ED    EKG  None  Radiology DG Chest Port 1 View  Result Date: 12/13/2019 CLINICAL DATA:  Shortness of breath for 2 days, history of COVID-19 positivity EXAM: PORTABLE CHEST 1 VIEW COMPARISON:  04/23/2019 FINDINGS: Cardiac shadow is stable. The lungs are well aerated bilaterally. Patchy infiltrates are seen bilaterally consistent with the given clinical history of COVID-19 positivity. No bony abnormality is seen. IMPRESSION: Patchy opacities consistent with the given clinical history. Electronically Signed   By: Alcide Clever M.D.   On: 12/13/2019 20:28    Procedures Procedures (including critical care time)  Medications Ordered in ED Medications  insulin regular, human (MYXREDLIN) 100 units/ 100 mL infusion (has no administration in time range)  0.9 %  sodium chloride infusion (has no administration in time range)  dextrose 5 %-0.45 % sodium chloride infusion (has no administration in time range)  dextrose 50 % solution 0-50 mL (has no administration in time range)  sodium chloride 0.9 % bolus 1,000 mL (1,000 mLs Intravenous New Bag/Given 12/13/19 2240)  albuterol (VENTOLIN HFA) 108 (90 Base) MCG/ACT inhaler 4 puff (4 puffs Inhalation Given 12/13/19 1947)  aerochamber Z-Stat Plus/medium 1 each (1 each Other Given 12/13/19 1947)  sodium chloride 0.9 % bolus 1,000 mL (1,000 mLs Intravenous New Bag/Given 12/13/19 2241)    ED Course  I have reviewed the triage vital signs and the nursing notes.  Pertinent labs & imaging  results that were available during my care of the patient were reviewed by me and considered in my medical decision making (see chart for details).    Eddie Irwin was evaluated in Emergency Department on 12/13/2019 for the symptoms described in the history of present illness. He was evaluated in the context of the global COVID-19 pandemic, which necessitated consideration that the patient might be at risk for infection with the SARS-CoV-2 virus that causes COVID-19. Institutional protocols and algorithms that pertain to the evaluation of patients at risk for COVID-19 are in a state of rapid change based on information released by regulatory bodies including the CDC and federal and state organizations. These policies and algorithms were followed during the patient's care in the ED.  MDM Rules/Calculators/A&P                      CRITICAL CARE Performed by: Bethann Berkshire Total critical care time: 35 minutes Critical care time was exclusive of separately billable procedures and treating other patients. Critical care was necessary to treat or prevent imminent or life-threatening deterioration. Critical care was time spent personally by me on the following activities: development of treatment plan with patient and/or surrogate as well as nursing, discussions with consultants, evaluation of patient's response to treatment, examination of patient, obtaining history from patient or surrogate, ordering and performing treatments and interventions, ordering and review of laboratory studies, ordering and review of radiographic studies, pulse oximetry and re-evaluation of patient's condition.  Final Clinical Impression(s) / ED Diagnoses Final diagnoses:  Diabetic ketoacidosis without coma associated with diabetes mellitus due to underlying condition [facility])  Patient with DKA and Covid infection.  Patient will be admitted to medicine  Rx / DC Orders ED Discharge Orders    None       Bethann Berkshire,  MD 12/13/19 2255

## 2019-12-13 NOTE — H&P (Addendum)
History and Physical    Patient Demographics:    Eddie Irwin:[id number] DOB: [birth year] DOA: 12/13/2019  PCP: Catha Nottingham, FNP  Patient coming from: Home  I have personally briefly reviewed patient's old medical records in [facility]  Chief Complaint: Shortness of breath   Assessment & Plan:     Assessment/Plan Principal Problem:   DKA (diabetic ketoacidoses) (HCC) Active Problems:   Obesity, unspecified   Chronic pain syndrome   COVID-19 virus infection   HTN (hypertension)   HLD (hyperlipidemia)     Principal Problem: Diabetes mellitus type 2 with diabetic ketoacidosis Patient noted to have high anion gap metabolic acidosis on presentation with hyperglycemia.  Is on Lantus, Humalog, Synjardy at home.  Patient also has associated neuropathy.  No previous A1c on record. -Patient will be admitted to progressive care unit at [facility] as [facility] is unable to accept Covid positive patients to the ICU.  Patient may be able to go off the insulin drip in the morning and be admitted to a regular floor bed with isolation at [facility] if a transfer bed is not available overnight.  -We will place on insulin drip -IV fluid resuscitation -Monitor fingersticks -Monitor BMP, beta hydroxybutyrate -Continue gabapentin  Other Active Problems: COVID-19 infection Patient is diagnosed with COVID-19 about 7 days ago.  Has been having shortness of breath over the last 2 days.  No significant hypoxemia on presentation. -We will place on zinc, ascorbic acid, cholecalciferol -Defer dexamethasone, remdesivir for now as patient has no evidence of hypoxemia and has presentation with DKA  Acute renal failure: Creatinine is 1.8 on presentation.  Baseline is unknown.  Appears to be acute renal failure.  Will give IV fluid resuscitation and monitor input, renal function closely.  Hypertension: Hold lisinopril due to AKI.  Hyperlipidemia: Continue atorvastatin, fenofibrate   Benign prostatic hypertrophy: Continue tamsulosin    DVT prophylaxis: Heparin Code Status:  Full code Family Communication: N/A  Disposition Plan: Admitted as inpatient for insulin drip, COVID-19 treatment.  Expected inpatient stay Consults called: N/A Admission status: Inpatient status    HPI:     HPI: Eddie Irwin is a 59 y.o. male with medical history significant of diabetes mellitus type 2, hypertension, hyperlipidemia, BPH who presented to the ER with shortness of breath.  Patient states he was diagnosed with COVID-19 about 7 days ago.  His wife is also tested positive.  He says over the last 2 days he has been having increased shortness of breath, weakness, fatigue, generalized myalgias.  He has not been taking his insulin due to feeling sick.  He is not also checked his blood sugars.  He is also associated nausea, dry cough.  Denies any fever, chills ED Course:  Vital Signs reviewed on presentation, significant for temperature 98.6, heart rate 109, blood pressure 123/65, saturation 92% on room air. Labs reviewed, significant for sodium 130, potassium 5.1, chloride 91, CO2 11, glucose 320, BUN 31, creatinine 1.81, WBC count 7.3, hemoglobin 14.0, hematocrit 45, platelets 202. Imaging personally Reviewed, chest x-ray shows bilateral patchy opacities consistent with COVID-19.    Review of systems:    Review of Systems: As per HPI otherwise 10 point review of systems negative.  All other review of systems is negative except the ones noted above in the HPI.    Past Medical and Surgical History:  Reviewed by me  Past Medical History:  Diagnosis Date  . Colon polyp   . Diabetes mellitus   .  Herniated disc   . Hyperlipidemia   . Joint pain     History reviewed. No pertinent surgical history.   Social History:  Reviewed by me   reports that he has quit smoking. He has never used smokeless tobacco. He reports current alcohol use. He reports that he does not use drugs.   Allergies:    Not on File  Family History :   Family History  Problem Relation Age of Onset  . Heart attack Brother 49       cocaine use  . Heart attack Mother 43   Family history reviewed, noted as above, not pertinent to current presentation.   Home Medications:    Prior to Admission medications   Medication Sig Start Date End Date Taking? Authorizing Provider  atorvastatin (LIPITOR) 10 MG tablet Take 10 mg by mouth daily.   Yes [provider]  diclofenac (VOLTAREN) 75 MG EC tablet Take 75 mg by mouth 2 (two) times daily. 12/07/19  Yes [provider]  fenofibrate (TRICOR) 145 MG tablet Take 145 mg by mouth daily. 12/07/19  Yes [provider]  fluticasone (FLONASE) 50 MCG/ACT nasal spray Place into both nostrils daily.  12/07/19  Yes [provider]  gabapentin (NEURONTIN) 600 MG tablet Take 1,200 mg by mouth 2 (two) times daily.   Yes [provider]  HUMALOG KWIKPEN 100 UNIT/ML KwikPen  12/07/19  Yes [provider]  LANTUS SOLOSTAR 100 UNIT/ML Solostar Pen  12/07/19  Yes [provider]  lisinopril (PRINIVIL,ZESTRIL) 10 MG tablet Take 10 mg by mouth daily.   Yes [provider]  oxyCODONE (ROXICODONE) 15 MG immediate release tablet Take 15 mg by mouth every 4 (four) hours. 12/06/19  Yes [provider]  SYNJARDY 12.04-999 MG TABS Take 1 tablet by mouth 2 (two) times daily. 12/07/19  Yes [provider]  Tamsulosin HCl (FLOMAX) 0.4 MG CAPS Take 0.4 mg by mouth daily.   Yes [provider]  topiramate (TOPAMAX) 25 MG tablet Take 25 mg by mouth 2 (two) times daily. 12/07/19  Yes [provider]  aspirin 81 MG tablet Take 81 mg by mouth daily.    [provider]    Physical Exam:    Physical Exam: Vitals:   12/13/19 1936 12/13/19 2030  BP: 123/65   Pulse: (!) 101 (!) 114  Resp: 18   Temp: 98.6 F (37 C)   TempSrc: Oral   SpO2: 94% 92%  Weight: 133.8  kg   Height: 6' (1.829 m)     Constitutional: NAD, calm, comfortable Vitals:   12/13/19 1936 12/13/19 2030  BP: 123/65   Pulse: (!) 101 (!) 114  Resp: 18   Temp: 98.6 F (37 C)   TempSrc: Oral   SpO2: 94% 92%  Weight: 133.8 kg   Height: 6' (1.829 m)    Eyes: PERRL, lids and conjunctivae normal ENMT: Mucous membranes are moist. Posterior pharynx clear of any exudate or lesions.Normal dentition.  Neck: normal, supple, no masses, no thyromegaly Respiratory: clear to auscultation bilaterally, no wheezing, mild bilateral crepitations. Normal respiratory effort. No accessory muscle use.  Cardiovascular: Regular rate and rhythm, no murmurs / rubs / gallops. No extremity edema. 2+ pedal pulses. No carotid bruits.  Abdomen: no tenderness, no masses palpated. No hepatosplenomegaly. Bowel sounds positive.  Musculoskeletal: no clubbing / cyanosis. No joint deformity upper and lower extremities. Good ROM, no contractures. Normal muscle tone.  Skin: no rashes, lesions, ulcers. No induration Neurologic: CN 2-12  grossly intact. Sensation intact, DTR normal. Strength 5/5 in all 4.  Psychiatric: Normal judgment and insight. Alert and oriented x 3. Normal mood.    Decubitus Ulcers: Not present on admission Catheters and tubes: None  Data Review:    Labs on Admission: I have personally reviewed following labs and imaging studies  CBC: Recent Labs  Lab 12/13/19 2120  WBC 7.3  NEUTROABS 5.9  HGB 14.0  HCT 45.3  MCV 96.8  PLT 101   Basic Metabolic Panel: Recent Labs  Lab 12/13/19 2120  NA 130*  K 5.1  CL 91*  CO2 11*  GLUCOSE 320*  BUN 31*  CREATININE 1.81*  CALCIUM 8.4*   GFR: Estimated Creatinine Clearance: 62.2 mL/min (A) (by C-G formula based on SCr of 1.81 mg/dL (H)). Liver Function Tests: No results for input(s): AST, ALT, ALKPHOS, BILITOT, PROT, ALBUMIN in the last 168 hours. No results for input(s): LIPASE, AMYLASE in the last 168 hours. No results for input(s):  AMMONIA in the last 168 hours. Coagulation Profile: No results for input(s): INR, PROTIME in the last 168 hours. Cardiac Enzymes: No results for input(s): CKTOTAL, CKMB, CKMBINDEX, TROPONINI in the last 168 hours. BNP (last 3 results) No results for input(s): PROBNP in the last 8760 hours. HbA1C: No results for input(s): HGBA1C in the last 72 hours. CBG: No results for input(s): GLUCAP in the last 168 hours. Lipid Profile: No results for input(s): CHOL, HDL, LDLCALC, TRIG, CHOLHDL, LDLDIRECT in the last 72 hours. Thyroid Function Tests: No results for input(s): TSH, T4TOTAL, FREET4, T3FREE, THYROIDAB in the last 72 hours. Anemia Panel: No results for input(s): VITAMINB12, FOLATE, FERRITIN, TIBC, IRON, RETICCTPCT in the last 72 hours. Urine analysis: No results found for: COLORURINE, APPEARANCEUR, [place], [place], [place], [place], BILIRUBINUR, KETONESUR, PROTEINUR, UROBILINOGEN, NITRITE, LEUKOCYTESUR   Imaging Results:      Radiological Exams on Admission: DG Chest Port 1 View  Result Date: 12/13/2019 CLINICAL DATA:  Shortness of breath for 2 days, history of COVID-19 positivity EXAM: PORTABLE CHEST 1 VIEW COMPARISON:  04/23/2019 FINDINGS: Cardiac shadow is stable. The lungs are well aerated bilaterally. Patchy infiltrates are seen bilaterally consistent with the given clinical history of COVID-19 positivity. No bony abnormality is seen. IMPRESSION: Patchy opacities consistent with the given clinical history. Electronically Signed   By: Inez Catalina M.D.   On: 12/13/2019 20:28      [facility] Ginette Otto MD [facility]  If 7PM-7AM, please contact night-coverage   12/13/2019, 10:54 PM

## 2019-12-14 DIAGNOSIS — I10 Essential (primary) hypertension: Secondary | ICD-10-CM

## 2019-12-14 DIAGNOSIS — U071 COVID-19: Principal | ICD-10-CM

## 2019-12-14 DIAGNOSIS — E785 Hyperlipidemia, unspecified: Secondary | ICD-10-CM

## 2019-12-14 DIAGNOSIS — J9601 Acute respiratory failure with hypoxia: Secondary | ICD-10-CM

## 2019-12-14 LAB — BASIC METABOLIC PANEL
Anion gap: 18 — ABNORMAL HIGH (ref 5–15)
Anion gap: 17 — ABNORMAL HIGH (ref 5–15)
Anion gap: 16 — ABNORMAL HIGH (ref 5–15)
Anion gap: 10 (ref 5–15)
Anion gap: 14 (ref 5–15)
BUN: 29 mg/dL — ABNORMAL HIGH (ref 6–20)
BUN: 28 mg/dL — ABNORMAL HIGH (ref 6–20)
BUN: 26 mg/dL — ABNORMAL HIGH (ref 6–20)
BUN: 24 mg/dL — ABNORMAL HIGH (ref 6–20)
BUN: 24 mg/dL — ABNORMAL HIGH (ref 6–20)
CO2: 13 mmol/L — ABNORMAL LOW (ref 22–32)
CO2: 17 mmol/L — ABNORMAL LOW (ref 22–32)
CO2: 18 mmol/L — ABNORMAL LOW (ref 22–32)
CO2: 20 mmol/L — ABNORMAL LOW (ref 22–32)
CO2: 18 mmol/L — ABNORMAL LOW (ref 22–32)
Calcium: 7.3 mg/dL — ABNORMAL LOW (ref 8.9–10.3)
Calcium: 7.9 mg/dL — ABNORMAL LOW (ref 8.9–10.3)
Calcium: 8.1 mg/dL — ABNORMAL LOW (ref 8.9–10.3)
Calcium: 7.8 mg/dL — ABNORMAL LOW (ref 8.9–10.3)
Calcium: 8.1 mg/dL — ABNORMAL LOW (ref 8.9–10.3)
Chloride: 100 mmol/L (ref 98–111)
Chloride: 98 mmol/L (ref 98–111)
Chloride: 98 mmol/L (ref 98–111)
Chloride: 100 mmol/L (ref 98–111)
Chloride: 101 mmol/L (ref 98–111)
Creatinine, Ser: 1.51 mg/dL — ABNORMAL HIGH (ref 0.61–1.24)
Creatinine, Ser: 1.45 mg/dL — ABNORMAL HIGH (ref 0.61–1.24)
Creatinine, Ser: 1.34 mg/dL — ABNORMAL HIGH (ref 0.61–1.24)
Creatinine, Ser: 1.21 mg/dL (ref 0.61–1.24)
Creatinine, Ser: 1.25 mg/dL — ABNORMAL HIGH (ref 0.61–1.24)
GFR calc Af Amer: 58 mL/min — ABNORMAL LOW (ref >60)
GFR calc Af Amer: >60 mL/min (ref >60)
GFR calc Af Amer: >60 mL/min (ref >60)
GFR calc Af Amer: >60 mL/min (ref >60)
GFR calc Af Amer: >60 mL/min (ref >60)
GFR calc non Af Amer: 50 mL/min — ABNORMAL LOW (ref >60)
GFR calc non Af Amer: 52 mL/min — ABNORMAL LOW (ref >60)
GFR calc non Af Amer: 58 mL/min — ABNORMAL LOW (ref >60)
GFR calc non Af Amer: >60 mL/min (ref >60)
GFR calc non Af Amer: >60 mL/min (ref >60)
Glucose, Bld: 171 mg/dL — ABNORMAL HIGH (ref 70–99)
Glucose, Bld: 173 mg/dL — ABNORMAL HIGH (ref 70–99)
Glucose, Bld: 168 mg/dL — ABNORMAL HIGH (ref 70–99)
Glucose, Bld: 146 mg/dL — ABNORMAL HIGH (ref 70–99)
Glucose, Bld: 258 mg/dL — ABNORMAL HIGH (ref 70–99)
Potassium: 4.8 mmol/L (ref 3.5–5.1)
Potassium: 5 mmol/L (ref 3.5–5.1)
Potassium: 4.9 mmol/L (ref 3.5–5.1)
Potassium: 5.2 mmol/L — ABNORMAL HIGH (ref 3.5–5.1)
Potassium: 5.3 mmol/L — ABNORMAL HIGH (ref 3.5–5.1)
Sodium: 131 mmol/L — ABNORMAL LOW (ref 135–145)
Sodium: 132 mmol/L — ABNORMAL LOW (ref 135–145)
Sodium: 132 mmol/L — ABNORMAL LOW (ref 135–145)
Sodium: 130 mmol/L — ABNORMAL LOW (ref 135–145)
Sodium: 133 mmol/L — ABNORMAL LOW (ref 135–145)

## 2019-12-14 LAB — CBG MONITORING, ED
Glucose-Capillary: 129 mg/dL — ABNORMAL HIGH (ref 70–99)
Glucose-Capillary: 133 mg/dL — ABNORMAL HIGH (ref 70–99)
Glucose-Capillary: 151 mg/dL — ABNORMAL HIGH (ref 70–99)
Glucose-Capillary: 160 mg/dL — ABNORMAL HIGH (ref 70–99)
Glucose-Capillary: 161 mg/dL — ABNORMAL HIGH (ref 70–99)
Glucose-Capillary: 164 mg/dL — ABNORMAL HIGH (ref 70–99)
Glucose-Capillary: 165 mg/dL — ABNORMAL HIGH (ref 70–99)
Glucose-Capillary: 174 mg/dL — ABNORMAL HIGH (ref 70–99)
Glucose-Capillary: 184 mg/dL — ABNORMAL HIGH (ref 70–99)
Glucose-Capillary: 205 mg/dL — ABNORMAL HIGH (ref 70–99)
Glucose-Capillary: 206 mg/dL — ABNORMAL HIGH (ref 70–99)
Glucose-Capillary: 221 mg/dL — ABNORMAL HIGH (ref 70–99)
Glucose-Capillary: 249 mg/dL — ABNORMAL HIGH (ref 70–99)
Glucose-Capillary: 266 mg/dL — ABNORMAL HIGH (ref 70–99)

## 2019-12-14 LAB — C-REACTIVE PROTEIN: CRP: 19.2 mg/dL — ABNORMAL HIGH (ref <1.0)

## 2019-12-14 LAB — LIPID PANEL
Cholesterol: 145 mg/dL (ref 0–200)
HDL: 30 mg/dL — ABNORMAL LOW (ref >40)
LDL Cholesterol: 65 mg/dL (ref 0–99)
Total CHOL/HDL Ratio: 4.8 RATIO
Triglycerides: 250 mg/dL — ABNORMAL HIGH (ref <150)
VLDL: 50 mg/dL — ABNORMAL HIGH (ref 0–40)

## 2019-12-14 LAB — RESPIRATORY PANEL BY RT PCR (FLU A&B, COVID)
Influenza A by PCR: NEGATIVE
Influenza B by PCR: NEGATIVE
SARS Coronavirus 2 by RT PCR: POSITIVE — AB

## 2019-12-14 LAB — HEMOGLOBIN A1C
Hgb A1c MFr Bld: 10.5 % — ABNORMAL HIGH (ref 4.8–5.6)
Mean Plasma Glucose: 254.65 mg/dL

## 2019-12-14 LAB — GLUCOSE, CAPILLARY: Glucose-Capillary: 182 mg/dL — ABNORMAL HIGH (ref 70–99)

## 2019-12-14 LAB — BETA-HYDROXYBUTYRIC ACID
Beta-Hydroxybutyric Acid: 5.36 mmol/L — ABNORMAL HIGH (ref 0.05–0.27)
Beta-Hydroxybutyric Acid: 2.83 mmol/L — ABNORMAL HIGH (ref 0.05–0.27)

## 2019-12-14 LAB — HIV ANTIBODY (ROUTINE TESTING W REFLEX): HIV Screen 4th Generation wRfx: NONREACTIVE

## 2019-12-14 LAB — FERRITIN: Ferritin: 557 ng/mL — ABNORMAL HIGH (ref 24–336)

## 2019-12-14 LAB — D-DIMER, QUANTITATIVE: D-Dimer, Quant: 1.17 ug/mL-FEU — ABNORMAL HIGH (ref 0.00–0.50)

## 2019-12-14 LAB — ABO/RH: ABO/RH(D): O POS

## 2019-12-14 MED ORDER — VITAMIN D3 25 MCG PO TABS
1000.0000 [IU] | ORAL_TABLET | Freq: Every day | ORAL | Status: DC
  Administered 2019-12-14 – 2019-12-19 (×6): 1000 [IU] via ORAL
  Filled 2019-12-14 (×13): qty 1

## 2019-12-14 MED ORDER — OXYCODONE HCL 5 MG PO TABS
15.0000 mg | ORAL_TABLET | Freq: Four times a day (QID) | ORAL | Status: DC | PRN
  Administered 2019-12-14 – 2019-12-19 (×16): 15 mg via ORAL
  Filled 2019-12-14 (×16): qty 3

## 2019-12-14 MED ORDER — INSULIN ASPART 100 UNIT/ML ~~LOC~~ SOLN
0.0000 [IU] | Freq: Three times a day (TID) | SUBCUTANEOUS | Status: DC
  Administered 2019-12-15: 09:00:00 3 [IU] via SUBCUTANEOUS
  Administered 2019-12-15 (×2): 5 [IU] via SUBCUTANEOUS
  Administered 2019-12-16: 08:00:00 2 [IU] via SUBCUTANEOUS
  Administered 2019-12-16: 3 [IU] via SUBCUTANEOUS
  Administered 2019-12-16: 8 [IU] via SUBCUTANEOUS
  Administered 2019-12-17: 12:00:00 3 [IU] via SUBCUTANEOUS
  Administered 2019-12-17: 2 [IU] via SUBCUTANEOUS
  Administered 2019-12-17: 18:00:00 5 [IU] via SUBCUTANEOUS
  Administered 2019-12-18 (×2): 8 [IU] via SUBCUTANEOUS
  Administered 2019-12-18: 09:00:00 3 [IU] via SUBCUTANEOUS
  Administered 2019-12-19: 09:00:00 5 [IU] via SUBCUTANEOUS
  Administered 2019-12-19: 11 [IU] via SUBCUTANEOUS
  Administered 2019-12-19: 16:00:00 15 [IU] via SUBCUTANEOUS

## 2019-12-14 MED ORDER — INSULIN ASPART 100 UNIT/ML ~~LOC~~ SOLN
0.0000 [IU] | Freq: Every day | SUBCUTANEOUS | Status: DC
  Administered 2019-12-15: 22:00:00 3 [IU] via SUBCUTANEOUS
  Administered 2019-12-16 – 2019-12-17 (×2): 2 [IU] via SUBCUTANEOUS
  Administered 2019-12-18: 4 [IU] via SUBCUTANEOUS

## 2019-12-14 MED ORDER — DEXAMETHASONE SODIUM PHOSPHATE 10 MG/ML IJ SOLN
6.0000 mg | INTRAMUSCULAR | Status: DC
  Administered 2019-12-14 – 2019-12-19 (×6): 6 mg via INTRAVENOUS
  Filled 2019-12-14: qty 1
  Filled 2019-12-14 (×2): qty 0.6
  Filled 2019-12-14 (×5): qty 1

## 2019-12-14 MED ORDER — CHLORHEXIDINE GLUCONATE CLOTH 2 % EX PADS
6.0000 | MEDICATED_PAD | Freq: Every day | CUTANEOUS | Status: DC
  Administered 2019-12-14 – 2019-12-19 (×6): 6 via TOPICAL

## 2019-12-14 MED ORDER — INSULIN GLARGINE 100 UNIT/ML ~~LOC~~ SOLN
40.0000 [IU] | Freq: Every day | SUBCUTANEOUS | Status: DC
  Administered 2019-12-14 – 2019-12-19 (×6): 40 [IU] via SUBCUTANEOUS
  Filled 2019-12-14 (×7): qty 0.4

## 2019-12-14 MED ORDER — FENOFIBRATE 160 MG PO TABS
160.0000 mg | ORAL_TABLET | Freq: Every day | ORAL | Status: DC
  Administered 2019-12-15 – 2019-12-18 (×4): 160 mg via ORAL
  Filled 2019-12-14 (×6): qty 1

## 2019-12-14 MED ORDER — SODIUM CHLORIDE 0.9 % IV SOLN
200.0000 mg | Freq: Once | INTRAVENOUS | Status: AC
  Administered 2019-12-14: 200 mg via INTRAVENOUS
  Filled 2019-12-14: qty 40

## 2019-12-14 MED ORDER — SODIUM ZIRCONIUM CYCLOSILICATE 10 G PO PACK: 10.0000 g | PACK | Freq: Once | ORAL | Status: DC

## 2019-12-14 MED ORDER — ORAL CARE MOUTH RINSE
15.0000 mL | Freq: Two times a day (BID) | OROMUCOSAL | Status: DC
  Administered 2019-12-14 – 2019-12-19 (×10): 15 mL via OROMUCOSAL

## 2019-12-14 MED ORDER — SODIUM CHLORIDE 0.9 % IV SOLN
100.0000 mg | Freq: Every day | INTRAVENOUS | Status: AC
  Administered 2019-12-15 – 2019-12-18 (×4): 100 mg via INTRAVENOUS
  Filled 2019-12-14 (×2): qty 20
  Filled 2019-12-14 (×2): qty 100

## 2019-12-14 NOTE — ED Notes (Signed)
Pt given sandwich 

## 2019-12-14 NOTE — ED Notes (Signed)
Report given to Heather RN in ICU 

## 2019-12-14 NOTE — Progress Notes (Signed)
Inpatient Diabetes Program Recommendations  AACE/ADA: New Consensus Statement on Inpatient Glycemic Control   Target Ranges:  Prepandial:   less than 140 mg/dL      Peak postprandial:   less than 180 mg/dL (1-2 hours)      Critically ill patients:  140 - 180 mg/dL   Results for Eddie Irwin, Eddie Irwin (MRN [id number]) as of 12/14/2019 10:43  Ref. Range 12/14/2019 00:12 12/14/2019 01:08 12/14/2019 02:20 12/14/2019 03:14 12/14/2019 04:19 12/14/2019 05:26 12/14/2019 06:20 12/14/2019 07:55  Glucose-Capillary Latest Ref Range: 70 - 99 mg/dL 366 (H) 440 (H) 347 (H) 151 (H) 133 (H) 160 (H) 164 (H) 174 (H)  Results for Eddie Irwin, Eddie Irwin (MRN [id number]) as of 12/14/2019 10:43  Ref. Range 12/14/2019 08:03  Beta-Hydroxybutyric Acid Latest Ref Range: 0.05 - 0.27 mmol/L 5.36 (H)   Results for Eddie Irwin, Eddie Irwin (MRN [id number]) as of 12/14/2019 10:43  Ref. Range 12/13/2019 21:20  Beta-Hydroxybutyric Acid Latest Ref Range: 0.05 - 0.27 mmol/L 11.65 (H)  Glucose Latest Ref Range: 70 - 99 mg/dL 884 (H)  Hemoglobin Z6S Latest Ref Range: 4.8 - 5.6 % 10.5 (H)   Review of Glycemic Control  Diabetes history: DM2 Outpatient Diabetes medications: Lantus 75 units QHS, Humalog 50 units TID with meals, Synjardy 12.04-999 mg BID Current orders for Inpatient glycemic control: IV insulin per DKA  Inpatient Diabetes Program Recommendations:   IV to SQ insulin at transition: Once acidosis is cleared as determined by MD, please consider ordering Lantus 40 units Q24H (based on 133 kg x 0.3 units), CBGs Q4H, and Novolog 0-15 units Q4H.  NOTE: Patient is currently in Emergency Department at [facility] on IV insulin for DKA and also COVID+.  Spoke with patient over the phone about diabetes and home regimen for diabetes control. Patient reports being followed by PCP for diabetes management and currently prescribed Lantus 75 units QHS, Humalog 50 units TID with meals, and Synjardy 12.04-999 mg BID for diabetes control. Patient admits that he does  not take DM medications consistently.  Patient reports that his glucose is usually in the 170's-200's mg/dl when he takes his DM medications as prescribed.  Inquired about prior A1C and patient reports not being able to recall last A1C value. Discussed A1C results (10.5% on 12/13/19) and explained that current A1C indicates an average glucose of 255 mg/dl over the past 2-3 months. Discussed glucose and A1C goals. Discussed importance of checking CBGs and maintaining good CBG control to prevent long-term and short-term complications. Explained how hyperglycemia leads to damage within blood vessels which lead to the common complications seen with uncontrolled diabetes. Stressed to the patient the importance of improving glycemic control to prevent further complications from uncontrolled diabetes. Discussed impact of nutrition, exercise, stress, sickness, and medications on diabetes control.  Patient reports that he has never been in DKA. Discussed basic pathophysiology of DKA and explained that labs would be continued to be drawn to ensure acidosis is cleared before he is transitioned off IV insulin. Encouraged patient to start taking DM medications as prescribed consistently to decrease risk of further complications of uncontrolled DM. Encouraged patient to follow up with PCP regarding improving DM control.  Patient verbalized understanding of information discussed and reports no further questions at this time related to diabetes.  Thanks, Orlando Penner, RN, MSN, CDE Diabetes Coordinator Inpatient Diabetes Program [phone] (Team Pager)

## 2019-12-14 NOTE — Progress Notes (Signed)
PROGRESS NOTE    Eddie Irwin  NTZ:[id number] DOB: 1961-07-12 DOA: 12/13/2019 PCP: Catha Nottingham, FNP    Brief Narrative:  HPI: Eddie Irwin is a 59 y.o. male with medical history significant of diabetes mellitus type 2, hypertension, hyperlipidemia, BPH who presented to the ER with shortness of breath.  Patient states he was diagnosed with COVID-19 about 7 days ago.  His wife is also tested positive.  He says over the last 2 days he has been having increased shortness of breath, weakness, fatigue, generalized myalgias.  He has not been taking his insulin due to feeling sick.  He is not also checked his blood sugars.  He is also associated nausea, dry cough.  Denies any fever, chills ED Course:  Vital Signs reviewed on presentation, significant for temperature 98.6, heart rate 109, blood pressure 123/65, saturation 92% on room air. Labs reviewed, significant for sodium 130, potassium 5.1, chloride 91, CO2 11, glucose 320, BUN 31, creatinine 1.81, WBC count 7.3, hemoglobin 14.0, hematocrit 45, platelets 202. Imaging personally Reviewed, chest x-ray shows bilateral patchy opacities consistent with COVID-19.   Assessment & Plan:   Principal Problem:   DKA (diabetic ketoacidoses) (HCC) Active Problems:   Obesity, unspecified   Chronic pain syndrome   COVID-19 virus infection   HTN (hypertension)   HLD (hyperlipidemia)   1. Diabetic ketoacidosis and type II diabetic.  Patient has uncontrolled diabetes with hyperglycemia.  He is chronically on Lantus, Humalog, Synjardy at home.  He reports compliance with medications, but since contracting COVID-19 reports that he has been having trouble thinking clearly and may have forgotten to take his medications.  He has been started on insulin infusion and IV fluids.  Overall anion gap is improving.  Will transition to Lantus based on diabetic coordinator recommendations. 2. Acute respiratory failure with hypoxia secondary to COVID-19 pneumonia.   Patient noted to have chest x-ray findings consistent with COVID-19 infection.  He is currently requiring 4 L of oxygen.  Breathing appears to be nonlabored.  He has been started on remdesivir and dexamethasone.  Continue to titrate oxygen as tolerated. 3. Acute renal failure.  Creatinine 1.8 on presentation.  This is improved to the normal range with IV hydration.  Continue to follow urine output.  Hold ACE inhibitor. 4. Hypertension.  Blood pressure currently stable.  Holding ACE inhibitor. 5. Hyperlipidemia.  Continue Lipitor 6. BPH.  Continue on tamsulosin   DVT prophylaxis: Heparin Code Status: Full code Family Communication: Discussed with patient Disposition Plan: Discharge home once DKA has resolved and respiratory status has stabilized.   Consultants:     Procedures:     Antimicrobials:   Remdesivir 1/6>   Subjective: Overall shortness of breath improving.  Has a nonproductive cough.  Still on insulin infusion.  Objective: Vitals:   12/14/19 1500 12/14/19 1530 12/14/19 1630 12/14/19 1700  BP: 123/63 (!) 127/57 106/62 (!) 98/52  Pulse: 85 81 79 85  Resp: (!) 27 (!) 27 (!) 30 (!) 30  Temp:      TempSrc:      SpO2: 95% 95% 94% 94%  Weight:      Height:        Intake/Output Summary (Last 24 hours) at 12/14/2019 1854 Last data filed at 12/14/2019 1302 Gross per 24 hour  Intake 1201.59 ml  Output --  Net 1201.59 ml   Filed Weights   12/13/19 1936  Weight: 133.8 kg    Examination:  General exam: Appears calm and comfortable  Respiratory system: Clear to auscultation. Respiratory effort normal. Cardiovascular system: S1 & S2 heard, RRR. No JVD, murmurs, rubs, gallops or clicks. No pedal edema. Gastrointestinal system: Abdomen is nondistended, soft and nontender. No organomegaly or masses felt. Normal bowel sounds heard. Central nervous system: Alert and oriented. No focal neurological deficits. Extremities: Symmetric 5 x 5 power. Skin: No rashes, lesions or  ulcers Psychiatry: Judgement and insight appear normal. Mood & affect appropriate.     Data Reviewed: I have personally reviewed following labs and imaging studies  CBC: Recent Labs  Lab 12/13/19 2120  WBC 7.3  NEUTROABS 5.9  HGB 14.0  HCT 45.3  MCV 96.8  PLT 202   Basic Metabolic Panel: Recent Labs  Lab 12/13/19 2120 12/14/19 0154 12/14/19 0804 12/14/19 1136 12/14/19 1615  NA 130* 131* 132* 132* 130*  K 5.1 4.8 5.0 4.9 5.2*  CL 91* 100 98 98 100  CO2 11* 13* 17* 18* 20*  GLUCOSE 320* 171* 173* 168* 146*  BUN 31* 29* 28* 26* 24*  CREATININE 1.81* 1.51* 1.45* 1.34* 1.21  CALCIUM 8.4* 7.3* 7.9* 8.1* 7.8*   GFR: Estimated Creatinine Clearance: 93.1 mL/min (by C-G formula based on SCr of 1.21 mg/dL). Liver Function Tests: No results for input(s): AST, ALT, ALKPHOS, BILITOT, PROT, ALBUMIN in the last 168 hours. No results for input(s): LIPASE, AMYLASE in the last 168 hours. No results for input(s): AMMONIA in the last 168 hours. Coagulation Profile: No results for input(s): INR, PROTIME in the last 168 hours. Cardiac Enzymes: No results for input(s): CKTOTAL, CKMB, CKMBINDEX, TROPONINI in the last 168 hours. BNP (last 3 results) No results for input(s): PROBNP in the last 8760 hours. HbA1C: Recent Labs    12/13/19 2120  HGBA1C 10.5*   CBG: Recent Labs  Lab 12/14/19 0526 12/14/19 0620 12/14/19 0755 12/14/19 0954 12/14/19 1200  GLUCAP 160* 164* 174* 161* 165*   Lipid Profile: Recent Labs    12/13/19 2120 12/14/19 0154  CHOL  --  145  HDL  --  30*  LDLCALC  --  65  TRIG 404* 250*  CHOLHDL  --  4.8   Thyroid Function Tests: No results for input(s): TSH, T4TOTAL, FREET4, T3FREE, THYROIDAB in the last 72 hours. Anemia Panel: Recent Labs    12/13/19 2120 12/14/19 1136  FERRITIN 474* 557*   Sepsis Labs: Recent Labs  Lab 12/13/19 2120  PROCALCITON 0.11  LATICACIDVEN 2.1*    Recent Results (from the past 240 hour(s))  Respiratory Panel by  RT PCR (Flu A&B, Covid) - Nasopharyngeal Swab     Status: Abnormal   Collection Time: 12/13/19 11:49 PM   Specimen: Nasopharyngeal Swab  Result Value Ref Range Status   SARS Coronavirus 2 by RT PCR POSITIVE (A) NEGATIVE Final    Comment: RESULT CALLED TO, READ BACK BY AND VERIFIED WITH: WALKER,T. AT 0102 ON 12/14/2019 BY EVA (NOTE) SARS-CoV-2 target nucleic acids are DETECTED. SARS-CoV-2 RNA is generally detectable in upper respiratory specimens  during the acute phase of infection. Positive results are indicative of the presence of the identified virus, but do not rule out bacterial infection or co-infection with other pathogens not detected by the test. Clinical correlation with patient history and other diagnostic information is necessary to determine patient infection status. The expected result is Negative. Fact Sheet for Patients:  https://www.moore.com/ Fact Sheet for Healthcare Providers: https://www.young.biz/ This test is not yet approved or cleared by the [place] FDA and  has been authorized for detection and/or diagnosis  of SARS-CoV-2 by FDA under an Emergency Use Authorization (EUA).  This EUA will remain in effect (meaning this test can be used ) for the duration of  the COVID-19 declaration under Section 564(b)(1) of the Act, 21 U.S.C. section 360bbb-3(b)(1), unless the authorization is terminated or revoked sooner.    Influenza A by PCR NEGATIVE NEGATIVE Final   Influenza B by PCR NEGATIVE NEGATIVE Final    Comment: (NOTE) The Xpert Xpress SARS-CoV-2/FLU/RSV assay is intended as an aid in  the diagnosis of influenza from Nasopharyngeal swab specimens and  should not be used as a sole basis for treatment. Nasal washings and  aspirates are unacceptable for Xpert Xpress SARS-CoV-2/FLU/RSV  testing. Fact Sheet for Patients: PinkCheek.be Fact Sheet for Healthcare  Providers: GravelBags.it This test is not yet approved or cleared by the [place] FDA and  has been authorized for detection and/or diagnosis of SARS-CoV-2 by  FDA under an Emergency Use Authorization (EUA). This EUA will remain  in effect (meaning this test can be used) for the duration of the  Covid-19 declaration under Section 564(b)(1) of the Act, 21  U.S.C. section 360bbb-3(b)(1), unless the authorization is  terminated or revoked. Performed at [facility], [address], [place], [place] [address]          Radiology Studies: [facility] 1 View  Result Date: 12/13/2019 CLINICAL DATA:  Shortness of breath for 2 days, history of COVID-19 positivity EXAM: PORTABLE CHEST 1 VIEW COMPARISON:  04/23/2019 FINDINGS: Cardiac shadow is stable. The lungs are well aerated bilaterally. Patchy infiltrates are seen bilaterally consistent with the given clinical history of COVID-19 positivity. No bony abnormality is seen. IMPRESSION: Patchy opacities consistent with the given clinical history. Electronically Signed   By: Inez Catalina M.D.   On: 12/13/2019 20:28        Scheduled Meds: . vitamin C  500 mg Oral Daily  . aspirin EC  81 mg Oral Daily  . atorvastatin  10 mg Oral Daily  . dexamethasone (DECADRON) injection  6 mg Intravenous Q24H  . [START ON 12/15/2019] fenofibrate  160 mg Oral QHS  . gabapentin  600 mg Oral BID  . heparin  5,000 Units Subcutaneous Q8H  . [START ON 12/15/2019] insulin aspart  0-15 Units Subcutaneous TID WC  . insulin aspart  0-5 Units Subcutaneous QHS  . insulin glargine  40 Units Subcutaneous Daily  . sodium zirconium cyclosilicate  10 g Oral Once  . tamsulosin  0.4 mg Oral Daily  . topiramate  25 mg Oral BID  . cholecalciferol  1,000 Units Oral Daily  . zinc sulfate  220 mg Oral Daily   Continuous Infusions: . sodium chloride    . insulin 1 Units/hr (12/14/19 1600)  . insulin 2.8 Units/hr (12/14/19 0526)  . [START ON  12/15/2019] remdesivir 100 mg in NS 100 mL       LOS: 1 day   Critical care procedure note Authorized and performed by: Kathie Dike Total critical care time: Approximately 35 minutes Due to high probability of clinically significant, life-threatening deterioration, the patient required my highest level of preparedness to intervene emergently and I personally spent this critical care time directly and personally managing the patient.  The critical care time included obtaining a history, examining the patient, pulse oximetry, ordering and review of studies, arranging urgent treatment with development of a management plan, evaluation of patient's response to treatment, frequent reassessment, discussions with other providers.  Critical care time was performed to assess  and manage the high probability of imminent, life-threatening deterioration that could result in multiorgan failure.  It was exclusive of separate billable procedures and treating other patients and teaching time.  Please see MDM section and the rest of the of note for further information on patient assessment and treatment   Erick Blinks, MD [facility]   If 7PM-7AM, please contact night-coverage [email or web address]  12/14/2019, 6:54 PM

## 2019-12-15 DIAGNOSIS — E0810 Diabetes mellitus due to underlying condition with ketoacidosis without coma: Secondary | ICD-10-CM

## 2019-12-15 DIAGNOSIS — G894 Chronic pain syndrome: Secondary | ICD-10-CM

## 2019-12-15 LAB — GLUCOSE, CAPILLARY
Glucose-Capillary: 175 mg/dL — ABNORMAL HIGH (ref 70–99)
Glucose-Capillary: 226 mg/dL — ABNORMAL HIGH (ref 70–99)
Glucose-Capillary: 220 mg/dL — ABNORMAL HIGH (ref 70–99)
Glucose-Capillary: 254 mg/dL — ABNORMAL HIGH (ref 70–99)

## 2019-12-15 LAB — BASIC METABOLIC PANEL
Anion gap: 17 — ABNORMAL HIGH (ref 5–15)
BUN: 24 mg/dL — ABNORMAL HIGH (ref 6–20)
CO2: 16 mmol/L — ABNORMAL LOW (ref 22–32)
Calcium: 8 mg/dL — ABNORMAL LOW (ref 8.9–10.3)
Chloride: 103 mmol/L (ref 98–111)
Creatinine, Ser: 1.18 mg/dL (ref 0.61–1.24)
GFR calc Af Amer: >60 mL/min (ref >60)
GFR calc non Af Amer: >60 mL/min (ref >60)
Glucose, Bld: 205 mg/dL — ABNORMAL HIGH (ref 70–99)
Potassium: 4.8 mmol/L (ref 3.5–5.1)
Sodium: 136 mmol/L (ref 135–145)

## 2019-12-15 LAB — RENAL FUNCTION PANEL
Albumin: 2.9 g/dL — ABNORMAL LOW (ref 3.5–5.0)
Anion gap: 16 — ABNORMAL HIGH (ref 5–15)
BUN: 23 mg/dL — ABNORMAL HIGH (ref 6–20)
CO2: 18 mmol/L — ABNORMAL LOW (ref 22–32)
Calcium: 8.3 mg/dL — ABNORMAL LOW (ref 8.9–10.3)
Chloride: 101 mmol/L (ref 98–111)
Creatinine, Ser: 1.18 mg/dL (ref 0.61–1.24)
GFR calc Af Amer: >60 mL/min (ref >60)
GFR calc non Af Amer: >60 mL/min (ref >60)
Glucose, Bld: 243 mg/dL — ABNORMAL HIGH (ref 70–99)
Phosphorus: 2 mg/dL — ABNORMAL LOW (ref 2.5–4.6)
Potassium: 5 mmol/L (ref 3.5–5.1)
Sodium: 135 mmol/L (ref 135–145)

## 2019-12-15 LAB — FERRITIN: Ferritin: 526 ng/mL — ABNORMAL HIGH (ref 24–336)

## 2019-12-15 LAB — MRSA PCR SCREENING: MRSA by PCR: NEGATIVE

## 2019-12-15 LAB — C-REACTIVE PROTEIN: CRP: 21.1 mg/dL — ABNORMAL HIGH (ref <1.0)

## 2019-12-15 LAB — D-DIMER, QUANTITATIVE: D-Dimer, Quant: 1.15 ug/mL-FEU — ABNORMAL HIGH (ref 0.00–0.50)

## 2019-12-15 MED ORDER — INSULIN ASPART 100 UNIT/ML ~~LOC~~ SOLN
5.0000 [IU] | Freq: Three times a day (TID) | SUBCUTANEOUS | Status: DC
  Administered 2019-12-16 – 2019-12-19 (×12): 5 [IU] via SUBCUTANEOUS

## 2019-12-15 NOTE — Progress Notes (Signed)
PROGRESS NOTE    Eddie Irwin  XBM:[id number] DOB: Aug 01, 1961 DOA: 12/13/2019 PCP: Eddie Nottingham, FNP    Brief Narrative:  HPI: Eddie Irwin is a 59 y.o. male with medical history significant of diabetes mellitus type 2, hypertension, hyperlipidemia, BPH who presented to the ER with shortness of breath.  Patient states he was diagnosed with COVID-19 about 7 days ago.  His wife is also tested positive.  He says over the last 2 days he has been having increased shortness of breath, weakness, fatigue, generalized myalgias.  He has not been taking his insulin due to feeling sick.  He is not also checked his blood sugars.  He is also associated nausea, dry cough.  Denies any fever, chills ED Course:  Vital Signs reviewed on presentation, significant for temperature 98.6, heart rate 109, blood pressure 123/65, saturation 92% on room air. Labs reviewed, significant for sodium 130, potassium 5.1, chloride 91, CO2 11, glucose 320, BUN 31, creatinine 1.81, WBC count 7.3, hemoglobin 14.0, hematocrit 45, platelets 202. Imaging personally Reviewed, chest x-ray shows bilateral patchy opacities consistent with COVID-19.   Assessment & Plan:   Principal Problem:   DKA (diabetic ketoacidoses) (HCC) Active Problems:   Obesity, unspecified   Chronic pain syndrome   COVID-19 virus infection   HTN (hypertension)   HLD (hyperlipidemia)   1. Diabetic ketoacidosis and type II diabetic.  Patient has uncontrolled diabetes with hyperglycemia.  He is chronically on Lantus, Humalog, Synjardy at home.  He reports compliance with medications, but since contracting COVID-19 reports that he has been having trouble thinking clearly and may have forgotten to take his medications.  He was treated with intravenous insulin and IV fluids.  Overall anion gap improved and he was transitioned to Lantus.  Blood sugars have been stable. 2. Acute respiratory failure with hypoxia secondary to COVID-19 pneumonia.  Patient  noted to have chest x-ray findings consistent with COVID-19 infection.  He is currently requiring 3 L of oxygen.  Breathing appears to be nonlabored.  He is on remdesivir and dexamethasone.  Continue to titrate oxygen as tolerated. 3. Acute renal failure.  Creatinine 1.8 on presentation.  This is improved to the normal range with IV hydration.  Continue to follow urine output.  Hold ACE inhibitor. 4. Hypertension.  Blood pressure currently stable.  Holding ACE inhibitor. 5. Hyperlipidemia.  Continue Lipitor 6. BPH.  Continue on tamsulosin   DVT prophylaxis: Heparin Code Status: Full code Family Communication: Discussed with patient Disposition Plan: Discharge home once DKA has resolved and respiratory status has stabilized.   Consultants:     Procedures:     Antimicrobials:   Remdesivir 1/6>   Subjective: Denies any nausea or vomiting.  Tolerating solid diet.  No diarrhea.  Feels her shortness of breath is improving.  Objective: Vitals:   12/15/19 1400 12/15/19 1600 12/15/19 1700 12/15/19 2000  BP: (!) 100/59 113/66 (!) 109/55 133/66  Pulse: 77 85 77 78  Resp:  (!) 24 (!) 32 16  Temp:      TempSrc:      SpO2: 92% 93% 93% 93%  Weight:      Height:        Intake/Output Summary (Last 24 hours) at 12/15/2019 2126 Last data filed at 12/15/2019 1721 Gross per 24 hour  Intake 3487.55 ml  Output 1650 ml  Net 1837.55 ml   Filed Weights   12/13/19 1936  Weight: 133.8 kg    Examination:  General exam: Alert, awake, oriented  x 3 Respiratory system: Clear to auscultation. Respiratory effort normal. Cardiovascular system:RRR. No murmurs, rubs, gallops. Gastrointestinal system: Abdomen is nondistended, soft and nontender. No organomegaly or masses felt. Normal bowel sounds heard. Central nervous system: Alert and oriented. No focal neurological deficits. Extremities: No C/C/E, +pedal pulses Skin: No rashes, lesions or ulcers Psychiatry: Judgement and insight appear normal.  Mood & affect appropriate.   Data Reviewed: I have personally reviewed following labs and imaging studies  CBC: Recent Labs  Lab 12/13/19 2120  WBC 7.3  NEUTROABS 5.9  HGB 14.0  HCT 45.3  MCV 96.8  PLT 202   Basic Metabolic Panel: Recent Labs  Lab 12/14/19 1136 12/14/19 1615 12/14/19 2014 12/15/19 0741 12/15/19 1654  NA 132* 130* 133* 136 135  K 4.9 5.2* 5.3* 4.8 5.0  CL 98 100 101 103 101  CO2 18* 20* 18* 16* 18*  GLUCOSE 168* 146* 258* 205* 243*  BUN 26* 24* 24* 24* 23*  CREATININE 1.34* 1.21 1.25* 1.18 1.18  CALCIUM 8.1* 7.8* 8.1* 8.0* 8.3*  PHOS  --   --   --   --  2.0*   GFR: Estimated Creatinine Clearance: 95.4 mL/min (by C-G formula based on SCr of 1.18 mg/dL). Liver Function Tests: Recent Labs  Lab 12/15/19 1654  ALBUMIN 2.9*   No results for input(s): LIPASE, AMYLASE in the last 168 hours. No results for input(s): AMMONIA in the last 168 hours. Coagulation Profile: No results for input(s): INR, PROTIME in the last 168 hours. Cardiac Enzymes: No results for input(s): CKTOTAL, CKMB, CKMBINDEX, TROPONINI in the last 168 hours. BNP (last 3 results) No results for input(s): PROBNP in the last 8760 hours. HbA1C: Recent Labs    12/13/19 2120  HGBA1C 10.5*   CBG: Recent Labs  Lab 12/14/19 2215 12/15/19 0753 12/15/19 1038 12/15/19 1652 12/15/19 2044  GLUCAP 182* 175* 226* 220* 254*   Lipid Profile: Recent Labs    12/13/19 2120 12/14/19 0154  CHOL  --  145  HDL  --  30*  LDLCALC  --  65  TRIG 404* 250*  CHOLHDL  --  4.8   Thyroid Function Tests: No results for input(s): TSH, T4TOTAL, FREET4, T3FREE, THYROIDAB in the last 72 hours. Anemia Panel: Recent Labs    12/14/19 1136 12/15/19 0741  FERRITIN 557* 526*   Sepsis Labs: Recent Labs  Lab 12/13/19 2120  PROCALCITON 0.11  LATICACIDVEN 2.1*    Recent Results (from the past 240 hour(s))  Respiratory Panel by RT PCR (Flu A&B, Covid) - Nasopharyngeal Swab     Status: Abnormal    Collection Time: 12/13/19 11:49 PM   Specimen: Nasopharyngeal Swab  Result Value Ref Range Status   SARS Coronavirus 2 by RT PCR POSITIVE (A) NEGATIVE Final    Comment: RESULT CALLED TO, READ BACK BY AND VERIFIED WITH: WALKER,T. AT 0102 ON 12/14/2019 BY EVA (NOTE) SARS-CoV-2 target nucleic acids are DETECTED. SARS-CoV-2 RNA is generally detectable in upper respiratory specimens  during the acute phase of infection. Positive results are indicative of the presence of the identified virus, but do not rule out bacterial infection or co-infection with other pathogens not detected by the test. Clinical correlation with patient history and other diagnostic information is necessary to determine patient infection status. The expected result is Negative. Fact Sheet for Patients:  https://www.moore.com/ Fact Sheet for Healthcare Providers: https://www.young.biz/ This test is not yet approved or cleared by the [place] FDA and  has been authorized for detection and/or diagnosis of  SARS-CoV-2 by FDA under an Emergency Use Authorization (EUA).  This EUA will remain in effect (meaning this test can be used ) for the duration of  the COVID-19 declaration under Section 564(b)(1) of the Act, 21 U.S.C. section 360bbb-3(b)(1), unless the authorization is terminated or revoked sooner.    Influenza A by PCR NEGATIVE NEGATIVE Final   Influenza B by PCR NEGATIVE NEGATIVE Final    Comment: (NOTE) The Xpert Xpress SARS-CoV-2/FLU/RSV assay is intended as an aid in  the diagnosis of influenza from Nasopharyngeal swab specimens and  should not be used as a sole basis for treatment. Nasal washings and  aspirates are unacceptable for Xpert Xpress SARS-CoV-2/FLU/RSV  testing. Fact Sheet for Patients: PinkCheek.be Fact Sheet for Healthcare Providers: GravelBags.it This test is not yet approved or cleared by the  [place] FDA and  has been authorized for detection and/or diagnosis of SARS-CoV-2 by  FDA under an Emergency Use Authorization (EUA). This EUA will remain  in effect (meaning this test can be used) for the duration of the  Covid-19 declaration under Section 564(b)(1) of the Act, 21  U.S.C. section 360bbb-3(b)(1), unless the authorization is  terminated or revoked. Performed at [facility], [address] [place], [place] [address]   MRSA PCR Screening     Status: None   Collection Time: 12/14/19  9:59 PM   Specimen: Nasal Mucosa; Nasopharyngeal  Result Value Ref Range Status   MRSA by PCR NEGATIVE NEGATIVE Final    Comment:        The GeneXpert MRSA Assay (FDA approved for NASAL specimens only), is one component of a comprehensive MRSA colonization surveillance program. It is not intended to diagnose MRSA infection nor to guide or monitor treatment for MRSA infections. Performed at [facility], [address] [place], [place] [address]          Radiology Studies: No results found.      Scheduled Meds: . vitamin C  500 mg Oral Daily  . aspirin EC  81 mg Oral Daily  . atorvastatin  10 mg Oral Daily  . Chlorhexidine Gluconate Cloth  6 each Topical Daily  . dexamethasone (DECADRON) injection  6 mg Intravenous Q24H  . fenofibrate  160 mg Oral QHS  . gabapentin  600 mg Oral BID  . heparin  5,000 Units Subcutaneous Q8H  . insulin aspart  0-15 Units Subcutaneous TID WC  . insulin aspart  0-5 Units Subcutaneous QHS  . [START ON 12/16/2019] insulin aspart  5 Units Subcutaneous TID WC  . insulin glargine  40 Units Subcutaneous Daily  . mouth rinse  15 mL Mouth Rinse BID  . sodium zirconium cyclosilicate  10 g Oral Once  . tamsulosin  0.4 mg Oral Daily  . topiramate  25 mg Oral BID  . cholecalciferol  1,000 Units Oral Daily  . zinc sulfate  220 mg Oral Daily   Continuous Infusions: . remdesivir 100 mg in NS 100 mL Stopped (12/15/19 0946)     LOS: 2 days    Time spent: 35 minutes.  Kathie Dike, MD [facility]   If 7PM-7AM, please contact night-coverage [email or web address]  12/15/2019, 9:26 PM

## 2019-12-15 NOTE — Progress Notes (Signed)
Inpatient Diabetes Program Recommendations  AACE/ADA: New Consensus Statement on Inpatient Glycemic Control   Target Ranges:  Prepandial:   less than 140 mg/dL      Peak postprandial:   less than 180 mg/dL (1-2 hours)      Critically ill patients:  140 - 180 mg/dL   Results for Eddie Irwin, Eddie Irwin (MRN [id number]) as of 12/15/2019 08:25  Ref. Range 12/14/2019 12:00 12/14/2019 15:55 12/14/2019 19:05 12/14/2019 20:22 12/14/2019 21:29 12/14/2019 22:15 12/15/2019 07:53  Glucose-Capillary Latest Ref Range: 70 - 99 mg/dL 372 (H) 902 (H) 111 (H) 249 (H)  Lantus 40 units @20 :26 206 (H) 182 (H) 175 (H)   Review of Glycemic Control  Diabetes history: DM2 Outpatient Diabetes medications: Lantus 75 units QHS, Humalog 50 units TID with meals, Synjardy 12.04-999 mg BID Current orders for Inpatient glycemic control: Lantus 40 units daily, Novolog 0-15 units TID with meals, Novolog 0-5 units QHS; Decadron 6 mg Q24H  Inpatient Diabetes Program Recommendations:   Insulin-Meal Coverage: If steroids are continued, please consider ordering Novolog 5 units TID with meals for meal coverage if patient eats at least 50% of meals.  Thanks, 12-17-1990, RN, MSN, CDE Diabetes Coordinator Inpatient Diabetes Program [phone] (Team Pager from 8am to 5pm)

## 2019-12-16 LAB — HEPATIC FUNCTION PANEL
ALT: 20 U/L (ref 0–44)
AST: 26 U/L (ref 15–41)
Albumin: 2.6 g/dL — ABNORMAL LOW (ref 3.5–5.0)
Alkaline Phosphatase: 48 U/L (ref 38–126)
Bilirubin, Direct: <0.1 mg/dL (ref 0.0–0.2)
Total Bilirubin: 0.7 mg/dL (ref 0.3–1.2)
Total Protein: 5.8 g/dL — ABNORMAL LOW (ref 6.5–8.1)

## 2019-12-16 LAB — BASIC METABOLIC PANEL
Anion gap: 10 (ref 5–15)
BUN: 19 mg/dL (ref 6–20)
CO2: 23 mmol/L (ref 22–32)
Calcium: 8.1 mg/dL — ABNORMAL LOW (ref 8.9–10.3)
Chloride: 102 mmol/L (ref 98–111)
Creatinine, Ser: 0.97 mg/dL (ref 0.61–1.24)
GFR calc Af Amer: >60 mL/min (ref >60)
GFR calc non Af Amer: >60 mL/min (ref >60)
Glucose, Bld: 140 mg/dL — ABNORMAL HIGH (ref 70–99)
Potassium: 3.6 mmol/L (ref 3.5–5.1)
Sodium: 135 mmol/L (ref 135–145)

## 2019-12-16 LAB — C-REACTIVE PROTEIN: CRP: 14.7 mg/dL — ABNORMAL HIGH (ref <1.0)

## 2019-12-16 LAB — D-DIMER, QUANTITATIVE: D-Dimer, Quant: 1.25 ug/mL-FEU — ABNORMAL HIGH (ref 0.00–0.50)

## 2019-12-16 LAB — GLUCOSE, CAPILLARY
Glucose-Capillary: 125 mg/dL — ABNORMAL HIGH (ref 70–99)
Glucose-Capillary: 279 mg/dL — ABNORMAL HIGH (ref 70–99)
Glucose-Capillary: 197 mg/dL — ABNORMAL HIGH (ref 70–99)
Glucose-Capillary: 228 mg/dL — ABNORMAL HIGH (ref 70–99)

## 2019-12-16 LAB — FERRITIN: Ferritin: 577 ng/mL — ABNORMAL HIGH (ref 24–336)

## 2019-12-16 MED ORDER — POTASSIUM CHLORIDE CRYS ER 20 MEQ PO TBCR
40.0000 meq | EXTENDED_RELEASE_TABLET | Freq: Once | ORAL | Status: AC
  Administered 2019-12-16: 40 meq via ORAL
  Filled 2019-12-16: qty 2

## 2019-12-16 MED ORDER — TOCILIZUMAB 400 MG/20ML IV SOLN
800.0000 mg | Freq: Once | INTRAVENOUS | Status: AC
  Administered 2019-12-16: 800 mg via INTRAVENOUS
  Filled 2019-12-16: qty 40

## 2019-12-16 NOTE — Care Management Important Message (Signed)
Important Message  Patient Details  Name: Eddie Irwin MRN: [id number] Date of Birth: 1961-11-05   Medicare Important Message Given:  Yes(RN agreed to deliver letter to patient  due to contact precautions)     Corey Harold 12/16/2019, 4:32 PM

## 2019-12-16 NOTE — Progress Notes (Signed)
PROGRESS NOTE    Eddie Irwin  OYD:[id number] DOB: 1961-03-03 DOA: 12/13/2019 PCP: Dorothyann Peng, FNP    Brief Narrative:  HPI: Eddie Irwin is a 59 y.o. male with medical history significant of diabetes mellitus type 2, hypertension, hyperlipidemia, BPH who presented to the ER with shortness of breath.  Patient states he was diagnosed with COVID-19 about 7 days ago.  His wife is also tested positive.  He says over the last 2 days he has been having increased shortness of breath, weakness, fatigue, generalized myalgias.  He has not been taking his insulin due to feeling sick.  He is not also checked his blood sugars.  He is also associated nausea, dry cough.  Denies any fever, chills ED Course:  Vital Signs reviewed on presentation, significant for temperature 98.6, heart rate 109, blood pressure 123/65, saturation 92% on room air. Labs reviewed, significant for sodium 130, potassium 5.1, chloride 91, CO2 11, glucose 320, BUN 31, creatinine 1.81, WBC count 7.3, hemoglobin 14.0, hematocrit 45, platelets 202. Imaging personally Reviewed, chest x-ray shows bilateral patchy opacities consistent with COVID-19.   Assessment & Plan:   Principal Problem:   DKA (diabetic ketoacidoses) ([place]) Active Problems:   Obesity, unspecified   Chronic pain syndrome   COVID-19 virus infection   HTN (hypertension)   HLD (hyperlipidemia)   1. Diabetic ketoacidosis in a type II diabetic.  Patient has uncontrolled diabetes with hyperglycemia.  He is chronically on Lantus, Humalog, Synjardy at home.  He reports compliance with medications, but since contracting COVID-19 reports that he has been having trouble thinking clearly and may have forgotten to take his medications.  He was treated with intravenous insulin and IV fluids.  Overall anion gap improved and he was transitioned to Lantus.  Blood sugars have been stable. 2. Acute respiratory failure with hypoxia secondary to COVID-19 pneumonia.  Patient  noted to have chest x-ray findings consistent with COVID-19 infection.  He is now requiring 6L oxygen.  Breathing appears to be nonlabored.  He is on remdesivir and dexamethasone.  Continue to titrate oxygen as tolerated. Will check LFTs today. If LFTs are not significantly elevated, can consider Actemra. I have discussed risks/benefits with the patient and he is agreeable. He does not appear to have any contraindications. 3. Acute renal failure.  Creatinine 1.8 on presentation.  This is improved to the normal range with IV hydration.  Continue to follow urine output.  Hold ACE inhibitor. 4. Hypertension.  Blood pressure currently stable.  Holding ACE inhibitor. 5. Hyperlipidemia.  Continue Lipitor 6. BPH.  Continue on tamsulosin   DVT prophylaxis: Heparin Code Status: Full code Family Communication: Discussed with patient. I asked him if there was anyone that he wanted me to call, but he refused. Disposition Plan: Discharge home once DKA has resolved and respiratory status has stabilized.   Consultants:     Procedures:     Antimicrobials:   Remdesivir 1/6>   Subjective: Patient had taken his oxygen off this morning. Oxygen saturations noted to be in the 70s. Oxygen was reapplied at 4L but he remained hypoxic. He was titrated up to 6L with improvement of oxygen saturations into the 90s. He has a dry cough. Does not feel more short of breath  Objective: Vitals:   12/16/19 0600 12/16/19 0700 12/16/19 0735 12/16/19 0800  BP: (!) 100/57 127/61  (!) 108/56  Pulse: 69 76 66 76  Resp: (!) 23 (!) 34 13 (!) 31  Temp:   98 F (  36.7 C)   TempSrc:   Oral   SpO2: 93% (!) 84% 94% 93%  Weight:      Height:        Intake/Output Summary (Last 24 hours) at 12/16/2019 1020 Last data filed at 12/16/2019 0736 Gross per 24 hour  Intake 2027.81 ml  Output 1950 ml  Net 77.81 ml   Filed Weights   12/13/19 1936  Weight: 133.8 kg    Examination:  General exam: Alert, awake, oriented x  3 Respiratory system: Clear to auscultation. Respiratory effort normal. Cardiovascular system:RRR. No murmurs, rubs, gallops. Gastrointestinal system: Abdomen is nondistended, soft and nontender. No organomegaly or masses felt. Normal bowel sounds heard. Central nervous system: Alert and oriented. No focal neurological deficits. Extremities: No C/C/E, +pedal pulses Skin: No rashes, lesions or ulcers Psychiatry: Judgement and insight appear normal. Flat affect.   Data Reviewed: I have personally reviewed following labs and imaging studies  CBC: Recent Labs  Lab 12/13/19 2120  WBC 7.3  NEUTROABS 5.9  HGB 14.0  HCT 45.3  MCV 96.8  PLT 202   Basic Metabolic Panel: Recent Labs  Lab 12/14/19 1615 12/14/19 2014 12/15/19 0741 12/15/19 1654 12/16/19 0508  NA 130* 133* 136 135 135  K 5.2* 5.3* 4.8 5.0 3.6  CL 100 101 103 101 102  CO2 20* 18* 16* 18* 23  GLUCOSE 146* 258* 205* 243* 140*  BUN 24* 24* 24* 23* 19  CREATININE 1.21 1.25* 1.18 1.18 0.97  CALCIUM 7.8* 8.1* 8.0* 8.3* 8.1*  PHOS  --   --   --  2.0*  --    GFR: Estimated Creatinine Clearance: 116.1 mL/min (by C-G formula based on SCr of 0.97 mg/dL). Liver Function Tests: Recent Labs  Lab 12/15/19 1654  ALBUMIN 2.9*   No results for input(s): LIPASE, AMYLASE in the last 168 hours. No results for input(s): AMMONIA in the last 168 hours. Coagulation Profile: No results for input(s): INR, PROTIME in the last 168 hours. Cardiac Enzymes: No results for input(s): CKTOTAL, CKMB, CKMBINDEX, TROPONINI in the last 168 hours. BNP (last 3 results) No results for input(s): PROBNP in the last 8760 hours. HbA1C: Recent Labs    12/13/19 2120  HGBA1C 10.5*   CBG: Recent Labs  Lab 12/15/19 0753 12/15/19 1038 12/15/19 1652 12/15/19 2044 12/16/19 0733  GLUCAP 175* 226* 220* 254* 125*   Lipid Profile: Recent Labs    12/13/19 2120 12/14/19 0154  CHOL  --  145  HDL  --  30*  LDLCALC  --  65  TRIG 404* 250*   CHOLHDL  --  4.8   Thyroid Function Tests: No results for input(s): TSH, T4TOTAL, FREET4, T3FREE, THYROIDAB in the last 72 hours. Anemia Panel: Recent Labs    12/15/19 0741 12/16/19 0508  FERRITIN 526* 577*   Sepsis Labs: Recent Labs  Lab 12/13/19 2120  PROCALCITON 0.11  LATICACIDVEN 2.1*    Recent Results (from the past 240 hour(s))  Respiratory Panel by RT PCR (Flu A&B, Covid) - Nasopharyngeal Swab     Status: Abnormal   Collection Time: 12/13/19 11:49 PM   Specimen: Nasopharyngeal Swab  Result Value Ref Range Status   SARS Coronavirus 2 by RT PCR POSITIVE (A) NEGATIVE Final    Comment: RESULT CALLED TO, READ BACK BY AND VERIFIED WITH: WALKER,T. AT 0102 ON 12/14/2019 BY EVA (NOTE) SARS-CoV-2 target nucleic acids are DETECTED. SARS-CoV-2 RNA is generally detectable in upper respiratory specimens  during the acute phase of infection. Positive results are  indicative of the presence of the identified virus, but do not rule out bacterial infection or co-infection with other pathogens not detected by the test. Clinical correlation with patient history and other diagnostic information is necessary to determine patient infection status. The expected result is Negative. Fact Sheet for Patients:  https://www.moore.com/ Fact Sheet for Healthcare Providers: https://www.young.biz/ This test is not yet approved or cleared by the [place] FDA and  has been authorized for detection and/or diagnosis of SARS-CoV-2 by FDA under an Emergency Use Authorization (EUA).  This EUA will remain in effect (meaning this test can be used ) for the duration of  the COVID-19 declaration under Section 564(b)(1) of the Act, 21 U.S.C. section 360bbb-3(b)(1), unless the authorization is terminated or revoked sooner.    Influenza A by PCR NEGATIVE NEGATIVE Final   Influenza B by PCR NEGATIVE NEGATIVE Final    Comment: (NOTE) The Xpert Xpress  SARS-CoV-2/FLU/RSV assay is intended as an aid in  the diagnosis of influenza from Nasopharyngeal swab specimens and  should not be used as a sole basis for treatment. Nasal washings and  aspirates are unacceptable for Xpert Xpress SARS-CoV-2/FLU/RSV  testing. Fact Sheet for Patients: https://www.moore.com/ Fact Sheet for Healthcare Providers: https://www.young.biz/ This test is not yet approved or cleared by the [place] FDA and  has been authorized for detection and/or diagnosis of SARS-CoV-2 by  FDA under an Emergency Use Authorization (EUA). This EUA will remain  in effect (meaning this test can be used) for the duration of the  Covid-19 declaration under Section 564(b)(1) of the Act, 21  U.S.C. section 360bbb-3(b)(1), unless the authorization is  terminated or revoked. Performed at [facility], [address] [place], [place] [address]   MRSA PCR Screening     Status: None   Collection Time: 12/14/19  9:59 PM   Specimen: Nasal Mucosa; Nasopharyngeal  Result Value Ref Range Status   MRSA by PCR NEGATIVE NEGATIVE Final    Comment:        The GeneXpert MRSA Assay (FDA approved for NASAL specimens only), is one component of a comprehensive MRSA colonization surveillance program. It is not intended to diagnose MRSA infection nor to guide or monitor treatment for MRSA infections. Performed at [facility], [address] [place], [place] [address]          Radiology Studies: No results found.      Scheduled Meds: . vitamin C  500 mg Oral Daily  . aspirin EC  81 mg Oral Daily  . atorvastatin  10 mg Oral Daily  . Chlorhexidine Gluconate Cloth  6 each Topical Daily  . dexamethasone (DECADRON) injection  6 mg Intravenous Q24H  . fenofibrate  160 mg Oral QHS  . gabapentin  600 mg Oral BID  . heparin  5,000 Units Subcutaneous Q8H  . insulin aspart  0-15 Units Subcutaneous TID WC  . insulin aspart  0-5 Units Subcutaneous  QHS  . insulin aspart  5 Units Subcutaneous TID WC  . insulin glargine  40 Units Subcutaneous Daily  . mouth rinse  15 mL Mouth Rinse BID  . tamsulosin  0.4 mg Oral Daily  . topiramate  25 mg Oral BID  . cholecalciferol  1,000 Units Oral Daily  . zinc sulfate  220 mg Oral Daily   Continuous Infusions: . remdesivir 100 mg in NS 100 mL 100 mg (12/16/19 0814)     LOS: 3 days   Time spent: 35 minutes.  Erick Blinks, MD [facility]  If 7PM-7AM, please contact night-coverage [email or web address]  12/16/2019, 10:20 AM

## 2019-12-17 LAB — BASIC METABOLIC PANEL
Anion gap: 8 (ref 5–15)
BUN: 16 mg/dL (ref 6–20)
CO2: 32 mmol/L (ref 22–32)
Calcium: 8.7 mg/dL — ABNORMAL LOW (ref 8.9–10.3)
Chloride: 99 mmol/L (ref 98–111)
Creatinine, Ser: 0.83 mg/dL (ref 0.61–1.24)
GFR calc Af Amer: >60 mL/min (ref >60)
GFR calc non Af Amer: >60 mL/min (ref >60)
Glucose, Bld: 119 mg/dL — ABNORMAL HIGH (ref 70–99)
Potassium: 3.8 mmol/L (ref 3.5–5.1)
Sodium: 139 mmol/L (ref 135–145)

## 2019-12-17 LAB — GLUCOSE, CAPILLARY
Glucose-Capillary: 132 mg/dL — ABNORMAL HIGH (ref 70–99)
Glucose-Capillary: 194 mg/dL — ABNORMAL HIGH (ref 70–99)
Glucose-Capillary: 240 mg/dL — ABNORMAL HIGH (ref 70–99)
Glucose-Capillary: 246 mg/dL — ABNORMAL HIGH (ref 70–99)

## 2019-12-17 LAB — D-DIMER, QUANTITATIVE: D-Dimer, Quant: 1.37 ug/mL-FEU — ABNORMAL HIGH (ref 0.00–0.50)

## 2019-12-17 LAB — FERRITIN: Ferritin: 491 ng/mL — ABNORMAL HIGH (ref 24–336)

## 2019-12-17 LAB — C-REACTIVE PROTEIN: CRP: 11 mg/dL — ABNORMAL HIGH (ref <1.0)

## 2019-12-17 NOTE — Progress Notes (Signed)
PROGRESS NOTE    Eddie Irwin  QIH:[id number] DOB: [birth year] DOA: 12/13/2019 PCP: Dorothyann Peng, FNP    Brief Narrative:  HPI: Eddie Irwin is a 59 y.o. male with medical history significant of diabetes mellitus type 2, hypertension, hyperlipidemia, BPH who presented to the ER with shortness of breath.  Patient states he was diagnosed with COVID-19 about 7 days ago.  His wife is also tested positive.  He says over the last 2 days he has been having increased shortness of breath, weakness, fatigue, generalized myalgias.  He has not been taking his insulin due to feeling sick.  He is not also checked his blood sugars.  He is also associated nausea, dry cough.  Denies any fever, chills ED Course:  Vital Signs reviewed on presentation, significant for temperature 98.6, heart rate 109, blood pressure 123/65, saturation 92% on room air. Labs reviewed, significant for sodium 130, potassium 5.1, chloride 91, CO2 11, glucose 320, BUN 31, creatinine 1.81, WBC count 7.3, hemoglobin 14.0, hematocrit 45, platelets 202. Imaging personally Reviewed, chest x-ray shows bilateral patchy opacities consistent with COVID-19.   Assessment & Plan:   Principal Problem:   DKA (diabetic ketoacidoses) ([place]) Active Problems:   Obesity, unspecified   Chronic pain syndrome   COVID-19 virus infection   HTN (hypertension)   HLD (hyperlipidemia)   1. Diabetic ketoacidosis in a type II diabetic.  Patient has uncontrolled diabetes with hyperglycemia.  He is chronically on Lantus, Humalog, Synjardy at home.  He reports compliance with medications, but since contracting COVID-19 reports that he has been having trouble thinking clearly and may have forgotten to take his medications.  He was treated with intravenous insulin and IV fluids.  Overall anion gap improved and he was transitioned to Lantus.  Blood sugars have been stable. 2. Acute respiratory failure with hypoxia secondary to COVID-19 pneumonia.  Patient  noted to have chest x-ray findings consistent with COVID-19 infection.  He was requiring up to 6L oxygen.  Breathing appears to be nonlabored.  He is currently on remdesivir and dexamethasone.  He received a dose of Actemra on 1/8.  Overall oxygenation appears to be improving and he is now down to 2 L.  Continue to wean down as tolerated 3. Acute renal failure.  Creatinine 1.8 on presentation.  This is improved to the normal range with IV hydration.  Continue to follow urine output.  Hold ACE inhibitor. 4. Hypertension.  Blood pressure currently stable.  Holding ACE inhibitor. 5. Hyperlipidemia.  Continue Lipitor 6. BPH.  Continue on tamsulosin   DVT prophylaxis: Heparin Code Status: Full code Family Communication: Discussed with patient. I asked him if there was anyone that he wanted me to call, but he refused. Disposition Plan: Discharge home once DKA has resolved and respiratory status has stabilized.  Transfer to [place] bed today.   Consultants:     Procedures:     Antimicrobials:   Remdesivir 1/6>   Subjective: Shortness of breath improving.  Overall oxygen needs are decreasing.  He was titrated down to 2 L today.  Overall, he is feeling better  Objective: Vitals:   12/17/19 1100 12/17/19 1300 12/17/19 1400 12/17/19 1600  BP: 118/66 132/72 137/86 (!) 155/89  Pulse: 63 (!) 58 94 (!) 59  Resp: 19 (!) 21 17 (!) 23  Temp:      TempSrc:      SpO2: 96% 94% 94% 92%  Weight:      Height:  Intake/Output Summary (Last 24 hours) at 12/17/2019 1758 Last data filed at 12/17/2019 1200 Gross per 24 hour  Intake 200 ml  Output 3000 ml  Net -2800 ml   Filed Weights   12/13/19 1936  Weight: 133.8 kg    Examination:  General exam: Alert, awake, oriented x 3 Respiratory system: Clear to auscultation. Respiratory effort normal. Cardiovascular system:RRR. No murmurs, rubs, gallops. Gastrointestinal system: Abdomen is nondistended, soft and nontender. No organomegaly or  masses felt. Normal bowel sounds heard. Central nervous system: Alert and oriented. No focal neurological deficits. Extremities: No C/C/E, +pedal pulses Skin: No rashes, lesions or ulcers Psychiatry: Judgement and insight appear normal. Mood & affect appropriate.    Data Reviewed: I have personally reviewed following labs and imaging studies  CBC: Recent Labs  Lab 12/13/19 2120  WBC 7.3  NEUTROABS 5.9  HGB 14.0  HCT 45.3  MCV 96.8  PLT 202   Basic Metabolic Panel: Recent Labs  Lab 12/14/19 2014 12/15/19 0741 12/15/19 1654 12/16/19 0508 12/17/19 0537  NA 133* 136 135 135 139  K 5.3* 4.8 5.0 3.6 3.8  CL 101 103 101 102 99  CO2 18* 16* 18* 23 32  GLUCOSE 258* 205* 243* 140* 119*  BUN 24* 24* 23* 19 16  CREATININE 1.25* 1.18 1.18 0.97 0.83  CALCIUM 8.1* 8.0* 8.3* 8.1* 8.7*  PHOS  --   --  2.0*  --   --    GFR: Estimated Creatinine Clearance: 135.7 mL/min (by C-G formula based on SCr of 0.83 mg/dL). Liver Function Tests: Recent Labs  Lab 12/15/19 1654 12/16/19 0508  AST  --  26  ALT  --  20  ALKPHOS  --  48  BILITOT  --  0.7  PROT  --  5.8*  ALBUMIN 2.9* 2.6*   No results for input(s): LIPASE, AMYLASE in the last 168 hours. No results for input(s): AMMONIA in the last 168 hours. Coagulation Profile: No results for input(s): INR, PROTIME in the last 168 hours. Cardiac Enzymes: No results for input(s): CKTOTAL, CKMB, CKMBINDEX, TROPONINI in the last 168 hours. BNP (last 3 results) No results for input(s): PROBNP in the last 8760 hours. HbA1C: No results for input(s): HGBA1C in the last 72 hours. CBG: Recent Labs  Lab 12/16/19 1716 12/16/19 2007 12/17/19 0841 12/17/19 1158 12/17/19 1658  GLUCAP 197* 228* 132* 194* 240*   Lipid Profile: No results for input(s): CHOL, HDL, LDLCALC, TRIG, CHOLHDL, LDLDIRECT in the last 72 hours. Thyroid Function Tests: No results for input(s): TSH, T4TOTAL, FREET4, T3FREE, THYROIDAB in the last 72 hours. Anemia Panel:  Recent Labs    12/16/19 0508 12/17/19 0537  FERRITIN 577* 491*   Sepsis Labs: Recent Labs  Lab 12/13/19 2120  PROCALCITON 0.11  LATICACIDVEN 2.1*    Recent Results (from the past 240 hour(s))  Respiratory Panel by RT PCR (Flu A&B, Covid) - Nasopharyngeal Swab     Status: Abnormal   Collection Time: 12/13/19 11:49 PM   Specimen: Nasopharyngeal Swab  Result Value Ref Range Status   SARS Coronavirus 2 by RT PCR POSITIVE (A) NEGATIVE Final    Comment: RESULT CALLED TO, READ BACK BY AND VERIFIED WITH: WALKER,T. AT 0102 ON 12/14/2019 BY EVA (NOTE) SARS-CoV-2 target nucleic acids are DETECTED. SARS-CoV-2 RNA is generally detectable in upper respiratory specimens  during the acute phase of infection. Positive results are indicative of the presence of the identified virus, but do not rule out bacterial infection or co-infection with other pathogens not  detected by the test. Clinical correlation with patient history and other diagnostic information is necessary to determine patient infection status. The expected result is Negative. Fact Sheet for Patients:  https://www.moore.com/ Fact Sheet for Healthcare Providers: https://www.young.biz/ This test is not yet approved or cleared by the [place] FDA and  has been authorized for detection and/or diagnosis of SARS-CoV-2 by FDA under an Emergency Use Authorization (EUA).  This EUA will remain in effect (meaning this test can be used ) for the duration of  the COVID-19 declaration under Section 564(b)(1) of the Act, 21 U.S.C. section 360bbb-3(b)(1), unless the authorization is terminated or revoked sooner.    Influenza A by PCR NEGATIVE NEGATIVE Final   Influenza B by PCR NEGATIVE NEGATIVE Final    Comment: (NOTE) The Xpert Xpress SARS-CoV-2/FLU/RSV assay is intended as an aid in  the diagnosis of influenza from Nasopharyngeal swab specimens and  should not be used as a sole basis for  treatment. Nasal washings and  aspirates are unacceptable for Xpert Xpress SARS-CoV-2/FLU/RSV  testing. Fact Sheet for Patients: https://www.moore.com/ Fact Sheet for Healthcare Providers: https://www.young.biz/ This test is not yet approved or cleared by the [place] FDA and  has been authorized for detection and/or diagnosis of SARS-CoV-2 by  FDA under an Emergency Use Authorization (EUA). This EUA will remain  in effect (meaning this test can be used) for the duration of the  Covid-19 declaration under Section 564(b)(1) of the Act, 21  U.S.C. section 360bbb-3(b)(1), unless the authorization is  terminated or revoked. Performed at [facility], [address] [place], [place] [address]   MRSA PCR Screening     Status: None   Collection Time: 12/14/19  9:59 PM   Specimen: Nasal Mucosa; Nasopharyngeal  Result Value Ref Range Status   MRSA by PCR NEGATIVE NEGATIVE Final    Comment:        The GeneXpert MRSA Assay (FDA approved for NASAL specimens only), is one component of a comprehensive MRSA colonization surveillance program. It is not intended to diagnose MRSA infection nor to guide or monitor treatment for MRSA infections. Performed at [facility], [address], [place], [place] [address]          Radiology Studies: No results found.      Scheduled Meds: . vitamin C  500 mg Oral Daily  . aspirin EC  81 mg Oral Daily  . atorvastatin  10 mg Oral Daily  . Chlorhexidine Gluconate Cloth  6 each Topical Daily  . dexamethasone (DECADRON) injection  6 mg Intravenous Q24H  . fenofibrate  160 mg Oral QHS  . gabapentin  600 mg Oral BID  . heparin  5,000 Units Subcutaneous Q8H  . insulin aspart  0-15 Units Subcutaneous TID WC  . insulin aspart  0-5 Units Subcutaneous QHS  . insulin aspart  5 Units Subcutaneous TID WC  . insulin glargine  40 Units Subcutaneous Daily  . mouth rinse  15 mL Mouth Rinse BID  . tamsulosin  0.4  mg Oral Daily  . topiramate  25 mg Oral BID  . cholecalciferol  1,000 Units Oral Daily  . zinc sulfate  220 mg Oral Daily   Continuous Infusions: . remdesivir 100 mg in NS 100 mL Stopped (12/17/19 0939)     LOS: 4 days   Time spent: 35 minutes.  Erick Blinks, MD [facility]   If 7PM-7AM, please contact night-coverage [email or web address]  12/17/2019, 5:58 PM

## 2019-12-18 LAB — BASIC METABOLIC PANEL
Anion gap: 13 (ref 5–15)
BUN: 16 mg/dL (ref 6–20)
CO2: 29 mmol/L (ref 22–32)
Calcium: 9 mg/dL (ref 8.9–10.3)
Chloride: 97 mmol/L — ABNORMAL LOW (ref 98–111)
Creatinine, Ser: 0.77 mg/dL (ref 0.61–1.24)
GFR calc Af Amer: >60 mL/min (ref >60)
GFR calc non Af Amer: >60 mL/min (ref >60)
Glucose, Bld: 185 mg/dL — ABNORMAL HIGH (ref 70–99)
Potassium: 3.6 mmol/L (ref 3.5–5.1)
Sodium: 139 mmol/L (ref 135–145)

## 2019-12-18 LAB — D-DIMER, QUANTITATIVE: D-Dimer, Quant: 1.24 ug/mL-FEU — ABNORMAL HIGH (ref 0.00–0.50)

## 2019-12-18 LAB — GLUCOSE, CAPILLARY
Glucose-Capillary: 187 mg/dL — ABNORMAL HIGH (ref 70–99)
Glucose-Capillary: 272 mg/dL — ABNORMAL HIGH (ref 70–99)
Glucose-Capillary: 259 mg/dL — ABNORMAL HIGH (ref 70–99)

## 2019-12-18 LAB — C-REACTIVE PROTEIN: CRP: 5.6 mg/dL — ABNORMAL HIGH (ref <1.0)

## 2019-12-18 LAB — FERRITIN: Ferritin: 443 ng/mL — ABNORMAL HIGH (ref 24–336)

## 2019-12-18 NOTE — Progress Notes (Signed)
SATURATION QUALIFICATIONS: (This note is used to comply with regulatory documentation for home oxygen)  Patient Saturations on Room Air at Rest = 87%  Patient Saturations on Room Air while Ambulating = 82%  Patient Saturations on 4 Liters of oxygen while Ambulating = 90%  Please briefly explain why patient needs home oxygen: unable to maintain O2 sats above 90% on room air

## 2019-12-18 NOTE — Progress Notes (Signed)
PROGRESS NOTE    HA PLACERES  YJE:[id number] DOB: Sep 24, 1961 DOA: 12/13/2019 PCP: Catha Nottingham, FNP    Brief Narrative:  HPI: Eddie Irwin is a 59 y.o. male with medical history significant of diabetes mellitus type 2, hypertension, hyperlipidemia, BPH who presented to the ER with shortness of breath.  Patient states he was diagnosed with COVID-19 about 7 days ago.  His wife is also tested positive.  He says over the last 2 days he has been having increased shortness of breath, weakness, fatigue, generalized myalgias.  He has not been taking his insulin due to feeling sick.  He is not also checked his blood sugars.  He is also associated nausea, dry cough.  Denies any fever, chills ED Course:  Vital Signs reviewed on presentation, significant for temperature 98.6, heart rate 109, blood pressure 123/65, saturation 92% on room air. Labs reviewed, significant for sodium 130, potassium 5.1, chloride 91, CO2 11, glucose 320, BUN 31, creatinine 1.81, WBC count 7.3, hemoglobin 14.0, hematocrit 45, platelets 202. Imaging personally Reviewed, chest x-ray shows bilateral patchy opacities consistent with COVID-19.   Assessment & Plan:   Principal Problem:   DKA (diabetic ketoacidoses) (HCC) Active Problems:   Obesity, unspecified   Chronic pain syndrome   COVID-19 virus infection   HTN (hypertension)   HLD (hyperlipidemia)   1. Diabetic ketoacidosis in a type II diabetic.  Patient has uncontrolled diabetes with hyperglycemia.  He is chronically on Lantus, Humalog, Synjardy at home.  He reports compliance with medications, but since contracting COVID-19 reports that he has been having trouble thinking clearly and may have forgotten to take his medications.  He was treated with intravenous insulin and IV fluids.  Overall anion gap improved and he was transitioned to Lantus.  Blood sugars have been stable. 2. Acute respiratory failure with hypoxia secondary to COVID-19 pneumonia.  Patient  noted to have chest x-ray findings consistent with COVID-19 infection.  He was requiring up to 6L oxygen.  Breathing appears to be nonlabored.  He is currently on remdesivir and dexamethasone.  He received a dose of Actemra on 1/8.  He was placed on room air and ambulated today.  He had to be placed back on 4 L of oxygen due to hypoxia and shortness of breath.  Continue to wean down as tolerated. 3. Acute renal failure.  Creatinine 1.8 on presentation.  This is improved to the normal range with IV hydration.  Continue to follow urine output.  Hold ACE inhibitor. 4. Hypertension.  Blood pressure currently stable.  Holding ACE inhibitor. 5. Hyperlipidemia.  Continue Lipitor 6. BPH.  Continue on tamsulosin   DVT prophylaxis: Heparin Code Status: Full code Family Communication: Discussed with patient. I asked him if there was anyone that he wanted me to call, but he refused. Disposition Plan: Discharge home once respiratory status has stabilized.   Consultants:     Procedures:     Antimicrobials:   Remdesivir 1/6>1/10   Subjective: Patient ambulated today and became hypoxic.  Had to be placed back on 4 L.  Feels that shortness of breath is slowly improving.  Objective: Vitals:   12/18/19 1059 12/18/19 1100 12/18/19 1402 12/18/19 1500  BP:   140/86 (!) 152/87  Pulse:   66 61  Resp:    18  Temp:      TempSrc:      SpO2: 90% 92% 94% 97%  Weight:      Height:  Intake/Output Summary (Last 24 hours) at 12/18/2019 1758 Last data filed at 12/18/2019 1500 Gross per 24 hour  Intake 240 ml  Output 5925 ml  Net -5685 ml   Filed Weights   12/13/19 1936  Weight: 133.8 kg    Examination:  General exam: Alert, awake, oriented x 3 Respiratory system: Clear to auscultation. Respiratory effort normal. Cardiovascular system:RRR. No murmurs, rubs, gallops. Gastrointestinal system: Abdomen is nondistended, soft and nontender. No organomegaly or masses felt. Normal bowel sounds  heard. Central nervous system: Alert and oriented. No focal neurological deficits. Extremities: No C/C/E, +pedal pulses Skin: No rashes, lesions or ulcers Psychiatry: Judgement and insight appear normal. Mood & affect appropriate.   Data Reviewed: I have personally reviewed following labs and imaging studies  CBC: Recent Labs  Lab 12/13/19 2120  WBC 7.3  NEUTROABS 5.9  HGB 14.0  HCT 45.3  MCV 96.8  PLT 202   Basic Metabolic Panel: Recent Labs  Lab 12/15/19 0741 12/15/19 1654 12/16/19 0508 12/17/19 0537 12/18/19 0435  NA 136 135 135 139 139  K 4.8 5.0 3.6 3.8 3.6  CL 103 101 102 99 97*  CO2 16* 18* 23 32 29  GLUCOSE 205* 243* 140* 119* 185*  BUN 24* 23* 19 16 16   CREATININE 1.18 1.18 0.97 0.83 0.77  CALCIUM 8.0* 8.3* 8.1* 8.7* 9.0  PHOS  --  2.0*  --   --   --    GFR: Estimated Creatinine Clearance: 140.8 mL/min (by C-G formula based on SCr of 0.77 mg/dL). Liver Function Tests: Recent Labs  Lab 12/15/19 1654 12/16/19 0508  AST  --  26  ALT  --  20  ALKPHOS  --  48  BILITOT  --  0.7  PROT  --  5.8*  ALBUMIN 2.9* 2.6*   No results for input(s): LIPASE, AMYLASE in the last 168 hours. No results for input(s): AMMONIA in the last 168 hours. Coagulation Profile: No results for input(s): INR, PROTIME in the last 168 hours. Cardiac Enzymes: No results for input(s): CKTOTAL, CKMB, CKMBINDEX, TROPONINI in the last 168 hours. BNP (last 3 results) No results for input(s): PROBNP in the last 8760 hours. HbA1C: No results for input(s): HGBA1C in the last 72 hours. CBG: Recent Labs  Lab 12/17/19 1658 12/17/19 2123 12/18/19 0820 12/18/19 1146 12/18/19 1718  GLUCAP 240* 246* 187* 272* 259*   Lipid Profile: No results for input(s): CHOL, HDL, LDLCALC, TRIG, CHOLHDL, LDLDIRECT in the last 72 hours. Thyroid Function Tests: No results for input(s): TSH, T4TOTAL, FREET4, T3FREE, THYROIDAB in the last 72 hours. Anemia Panel: Recent Labs    12/17/19 0537 12/18/19  0435  FERRITIN 491* 443*   Sepsis Labs: Recent Labs  Lab 12/13/19 2120  PROCALCITON 0.11  LATICACIDVEN 2.1*    Recent Results (from the past 240 hour(s))  Respiratory Panel by RT PCR (Flu A&B, Covid) - Nasopharyngeal Swab     Status: Abnormal   Collection Time: 12/13/19 11:49 PM   Specimen: Nasopharyngeal Swab  Result Value Ref Range Status   SARS Coronavirus 2 by RT PCR POSITIVE (A) NEGATIVE Final    Comment: RESULT CALLED TO, READ BACK BY AND VERIFIED WITH: WALKER,T. AT 0102 ON 12/14/2019 BY EVA (NOTE) SARS-CoV-2 target nucleic acids are DETECTED. SARS-CoV-2 RNA is generally detectable in upper respiratory specimens  during the acute phase of infection. Positive results are indicative of the presence of the identified virus, but do not rule out bacterial infection or co-infection with other pathogens not detected  by the test. Clinical correlation with patient history and other diagnostic information is necessary to determine patient infection status. The expected result is Negative. Fact Sheet for Patients:  PinkCheek.be Fact Sheet for Healthcare Providers: GravelBags.it This test is not yet approved or cleared by the [place] FDA and  has been authorized for detection and/or diagnosis of SARS-CoV-2 by FDA under an Emergency Use Authorization (EUA).  This EUA will remain in effect (meaning this test can be used ) for the duration of  the COVID-19 declaration under Section 564(b)(1) of the Act, 21 U.S.C. section 360bbb-3(b)(1), unless the authorization is terminated or revoked sooner.    Influenza A by PCR NEGATIVE NEGATIVE Final   Influenza B by PCR NEGATIVE NEGATIVE Final    Comment: (NOTE) The Xpert Xpress SARS-CoV-2/FLU/RSV assay is intended as an aid in  the diagnosis of influenza from Nasopharyngeal swab specimens and  should not be used as a sole basis for treatment. Nasal washings and  aspirates are  unacceptable for Xpert Xpress SARS-CoV-2/FLU/RSV  testing. Fact Sheet for Patients: PinkCheek.be Fact Sheet for Healthcare Providers: GravelBags.it This test is not yet approved or cleared by the [place] FDA and  has been authorized for detection and/or diagnosis of SARS-CoV-2 by  FDA under an Emergency Use Authorization (EUA). This EUA will remain  in effect (meaning this test can be used) for the duration of the  Covid-19 declaration under Section 564(b)(1) of the Act, 21  U.S.C. section 360bbb-3(b)(1), unless the authorization is  terminated or revoked. Performed at [facility], [address], [place], [place] [address]   MRSA PCR Screening     Status: None   Collection Time: 12/14/19  9:59 PM   Specimen: Nasal Mucosa; Nasopharyngeal  Result Value Ref Range Status   MRSA by PCR NEGATIVE NEGATIVE Final    Comment:        The GeneXpert MRSA Assay (FDA approved for NASAL specimens only), is one component of a comprehensive MRSA colonization surveillance program. It is not intended to diagnose MRSA infection nor to guide or monitor treatment for MRSA infections. Performed at [facility], [address], [place], [place] [address]          Radiology Studies: No results found.      Scheduled Meds: . vitamin C  500 mg Oral Daily  . aspirin EC  81 mg Oral Daily  . atorvastatin  10 mg Oral Daily  . Chlorhexidine Gluconate Cloth  6 each Topical Daily  . dexamethasone (DECADRON) injection  6 mg Intravenous Q24H  . fenofibrate  160 mg Oral QHS  . gabapentin  600 mg Oral BID  . heparin  5,000 Units Subcutaneous Q8H  . insulin aspart  0-15 Units Subcutaneous TID WC  . insulin aspart  0-5 Units Subcutaneous QHS  . insulin aspart  5 Units Subcutaneous TID WC  . insulin glargine  40 Units Subcutaneous Daily  . mouth rinse  15 mL Mouth Rinse BID  . tamsulosin  0.4 mg Oral Daily  . topiramate  25 mg Oral BID   . cholecalciferol  1,000 Units Oral Daily  . zinc sulfate  220 mg Oral Daily   Continuous Infusions:    LOS: 5 days   Time spent: 35 minutes.  Kathie Dike, MD [facility]   If 7PM-7AM, please contact night-coverage [email or web address]  12/18/2019, 5:58 PM

## 2019-12-19 LAB — GLUCOSE, CAPILLARY
Glucose-Capillary: 304 mg/dL — ABNORMAL HIGH (ref 70–99)
Glucose-Capillary: 211 mg/dL — ABNORMAL HIGH (ref 70–99)
Glucose-Capillary: 312 mg/dL — ABNORMAL HIGH (ref 70–99)
Glucose-Capillary: 377 mg/dL — ABNORMAL HIGH (ref 70–99)

## 2019-12-19 LAB — BASIC METABOLIC PANEL
Anion gap: 13 (ref 5–15)
BUN: 20 mg/dL (ref 6–20)
CO2: 31 mmol/L (ref 22–32)
Calcium: 9.3 mg/dL (ref 8.9–10.3)
Chloride: 95 mmol/L — ABNORMAL LOW (ref 98–111)
Creatinine, Ser: 0.93 mg/dL (ref 0.61–1.24)
GFR calc Af Amer: >60 mL/min (ref >60)
GFR calc non Af Amer: >60 mL/min (ref >60)
Glucose, Bld: 216 mg/dL — ABNORMAL HIGH (ref 70–99)
Potassium: 3.7 mmol/L (ref 3.5–5.1)
Sodium: 139 mmol/L (ref 135–145)

## 2019-12-19 MED ORDER — GUAIFENESIN-DM 100-10 MG/5ML PO SYRP: 10.0000 mL | ORAL_SOLUTION | ORAL | 0 refills | Status: DC | PRN

## 2019-12-19 MED ORDER — DEXAMETHASONE 6 MG PO TABS: 6.0000 mg | ORAL_TABLET | Freq: Every day | ORAL | 0 refills | Status: AC

## 2019-12-19 MED ORDER — LANTUS SOLOSTAR 100 UNIT/ML ~~LOC~~ SOPN: 75.0000 [IU] | PEN_INJECTOR | Freq: Every day | SUBCUTANEOUS | 11 refills | Status: AC

## 2019-12-19 MED ORDER — HUMALOG KWIKPEN 100 UNIT/ML ~~LOC~~ SOPN: 50.0000 [IU] | PEN_INJECTOR | Freq: Three times a day (TID) | SUBCUTANEOUS | 11 refills | Status: AC

## 2019-12-19 MED ORDER — ALBUTEROL SULFATE HFA 108 (90 BASE) MCG/ACT IN AERS: 2.0000 | INHALATION_SPRAY | RESPIRATORY_TRACT | 0 refills | Status: DC | PRN

## 2019-12-19 NOTE — Progress Notes (Signed)
Reviewed d/c instructions, follow up care, COVID isolation, and medication changes. Pt states understanding of changes of care. Home O2 delivered and tank at bedside. Instructed pt to wear O2 at all times and flow rate is 3L. He expressed understanding. IV removed. Pt has all belongings accounted for. Requests paper scrubs for d/c. Family member to transport pt home. Advised pt about masking and having someone pick his prescriptions up for him that is COVID negative.

## 2019-12-19 NOTE — TOC Transition Note (Addendum)
Transition of Care [facility]) - CM/SW Discharge Note   Patient Details  Name: Eddie Irwin MRN: [id number] Date of Birth: April 03, 1961  Transition of [facility]) CM/SW Contact:  Tajee Savant, Chrystine Oiler, RN Phone Number: 12/19/2019, 2:03 PM   Clinical Narrative:   Patient ready for discharge today. Qualifies for oxygen. Lives in [place], referred to [facility]. Will need portable tank delivered to room prior to DC.    ADDENDUM: [facility] reports they are out of network with insurance. Referred to [facility]. [facility] to bring a portable tank to hospital today.   Final next level of care: Home/Self Care Barriers to Discharge: Barriers Resolved   Patient Goals and CMS Choice Patient states their goals for this hospitalization and ongoing recovery are:: go home CMS Medicare.gov Compare Post Acute Care list provided to:: Patient         Discharge Plan and Services   Discharge Planning Services: CM Consult Post Acute Care Choice: Durable Medical Equipment          DME Arranged: Oxygen DME Agency: Other - Comment([facility]) Date DME Agency Contacted: 12/19/19 Time DME Agency Contacted: 1330 Representative spoke with at DME Agency: Delorise Shiner            Social Determinants of Health (SDOH) Interventions     Readmission Risk Interventions No flowsheet data found.

## 2019-12-19 NOTE — Progress Notes (Signed)
SATURATION QUALIFICATIONS: (This note is used to comply with regulatory documentation for home oxygen)  Patient Saturations on Room Air at Rest = 88%  Patient Saturations on Room Air while Ambulating = 85%  Patient Saturations on 3 Liters of oxygen while Ambulating = 90%  Please briefly explain why patient needs home oxygen:unable to maintain O2 sat above 90% on RA.

## 2019-12-19 NOTE — Progress Notes (Signed)
Inpatient Diabetes Program Recommendations  AACE/ADA: New Consensus Statement on Inpatient Glycemic Control (2015)  Target Ranges:  Prepandial:   less than 140 mg/dL      Peak postprandial:   less than 180 mg/dL (1-2 hours)      Critically ill patients:  140 - 180 mg/dL   Lab Results  Component Value Date   GLUCAP 312 (H) 12/19/2019   HGBA1C 10.5 (H) 12/13/2019    Review of Glycemic Control Results for HADI, DUBIN (MRN [id number]) as of 12/19/2019 11:29  Ref. Range 12/18/2019 17:18 12/18/2019 21:09 12/19/2019 07:32 12/19/2019 10:59  Glucose-Capillary Latest Ref Range: 70 - 99 mg/dL 641 (H) 583 (H) 094 (H) 312 (H)   Diabetes history:DM2 Outpatient Diabetes medications:Lantus 75 units QHS, Humalog 50 units TID with meals, Synjardy 12.04-999 mg BID Current orders for Inpatient glycemic control: Lantus 40 units daily, Novolog 0-15 units TID with meals, Novolog 0-5 units QHS, Novolog 5 units TID; Decadron 6 mg Q24H  Inpatient Diabetes Program Recommendations:   Consider increasing Lantus to 45 units BID and Novolog 7 units TID (assuming patient is consuming >50% of meal).  Thanks, Lujean Rave, MSN, RNC-OB Diabetes Coordinator [phone] (8a-5p)

## 2019-12-19 NOTE — Care Management Important Message (Signed)
Important Message  Patient Details  Name: Eddie Irwin MRN: [id number] Date of Birth: 1960/12/16   Medicare Important Message Given:  Yes(RN will deliver letter to patient due to contact precautions)     Corey Harold 12/19/2019, 3:10 PM

## 2019-12-19 NOTE — Discharge Instructions (Signed)
Person Under Monitoring Name: Eddie Irwin  Location: [address] [place] [address]   Infection Prevention Recommendations for Individuals Confirmed to have, or Being Evaluated for, 2019 Novel Coronavirus (COVID-19) Infection Who Receive Care at Home  Individuals who are confirmed to have, or are being evaluated for, COVID-19 should follow the prevention steps below until a healthcare provider or local or state health department says they can return to normal activities.  Stay home except to get medical care You should restrict activities outside your home, except for getting medical care. Do not go to work, school, or public areas, and do not use public transportation or taxis.  Call ahead before visiting your doctor Before your medical appointment, call the healthcare provider and tell them that you have, or are being evaluated for, COVID-19 infection. This will help the healthcare provider's office take steps to keep other people from getting infected. Ask your healthcare provider to call the local or state health department.  Monitor your symptoms Seek prompt medical attention if your illness is worsening (e.g., difficulty breathing). Before going to your medical appointment, call the healthcare provider and tell them that you have, or are being evaluated for, COVID-19 infection. Ask your healthcare provider to call the local or state health department.  Wear a facemask You should wear a facemask that covers your nose and mouth when you are in the same room with other people and when you visit a healthcare provider. People who live with or visit you should also wear a facemask while they are in the same room with you.  Separate yourself from other people in your home As much as possible, you should stay in a different room from other people in your home. Also, you should use a separate bathroom, if available.  Avoid sharing household items You should not share  dishes, drinking glasses, cups, eating utensils, towels, bedding, or other items with other people in your home. After using these items, you should wash them thoroughly with soap and water.  Cover your coughs and sneezes Cover your mouth and nose with a tissue when you cough or sneeze, or you can cough or sneeze into your sleeve. Throw used tissues in a lined trash can, and immediately wash your hands with soap and water for at least 20 seconds or use an alcohol-based hand rub.  Wash your [employer] your hands often and thoroughly with soap and water for at least 20 seconds. You can use an alcohol-based hand sanitizer if soap and water are not available and if your hands are not visibly dirty. Avoid touching your eyes, nose, and mouth with unwashed hands.   Prevention Steps for Caregivers and Household Members of Individuals Confirmed to have, or Being Evaluated for, COVID-19 Infection Being Cared for in the Home  If you live with, or provide care at home for, a person confirmed to have, or being evaluated for, COVID-19 infection please follow these guidelines to prevent infection:  Follow healthcare provider's instructions Make sure that you understand and can help the patient follow any healthcare provider instructions for all care.  Provide for the patient's basic needs You should help the patient with basic needs in the home and provide support for getting groceries, prescriptions, and other personal needs.  Monitor the patient's symptoms If they are getting sicker, call his or her medical provider and tell them that the patient has, or is being evaluated for, COVID-19 infection. This will help the healthcare provider's office  take steps to keep other people from getting infected. Ask the healthcare provider to call the local or state health department.  Limit the number of people who have contact with the patient  If possible, have only one caregiver for the patient.  Other  household members should stay in another home or place of residence. If this is not possible, they should stay  in another room, or be separated from the patient as much as possible. Use a separate bathroom, if available.  Restrict visitors who do not have an essential need to be in the home.  Keep older adults, very young children, and other sick people away from the patient Keep older adults, very young children, and those who have compromised immune systems or chronic health conditions away from the patient. This includes people with chronic heart, lung, or kidney conditions, diabetes, and cancer.  Ensure good ventilation Make sure that shared spaces in the home have good air flow, such as from an air conditioner or an opened window, weather permitting.  Wash your hands often  Wash your hands often and thoroughly with soap and water for at least 20 seconds. You can use an alcohol based hand sanitizer if soap and water are not available and if your hands are not visibly dirty.  Avoid touching your eyes, nose, and mouth with unwashed hands.  Use disposable paper towels to dry your hands. If not available, use dedicated cloth towels and replace them when they become wet.  Wear a facemask and gloves  Wear a disposable facemask at all times in the room and gloves when you touch or have contact with the patient's blood, body fluids, and/or secretions or excretions, such as sweat, saliva, sputum, nasal mucus, vomit, urine, or feces.  Ensure the mask fits over your nose and mouth tightly, and do not touch it during use.  Throw out disposable facemasks and gloves after using them. Do not reuse.  Wash your hands immediately after removing your facemask and gloves.  If your personal clothing becomes contaminated, carefully remove clothing and launder. Wash your hands after handling contaminated clothing.  Place all used disposable facemasks, gloves, and other waste in a lined container before  disposing them with other household waste.  Remove gloves and wash your hands immediately after handling these items.  Do not share dishes, glasses, or other household items with the patient  Avoid sharing household items. You should not share dishes, drinking glasses, cups, eating utensils, towels, bedding, or other items with a patient who is confirmed to have, or being evaluated for, COVID-19 infection.  After the person uses these items, you should wash them thoroughly with soap and water.  Wash laundry thoroughly  Immediately remove and wash clothes or bedding that have blood, body fluids, and/or secretions or excretions, such as sweat, saliva, sputum, nasal mucus, vomit, urine, or feces, on them.  Wear gloves when handling laundry from the patient.  Read and follow directions on labels of laundry or clothing items and detergent. In general, wash and dry with the warmest temperatures recommended on the label.  Clean all areas the individual has used often  Clean all touchable surfaces, such as counters, tabletops, doorknobs, bathroom fixtures, toilets, phones, keyboards, tablets, and bedside tables, every day. Also, clean any surfaces that may have blood, body fluids, and/or secretions or excretions on them.  Wear gloves when cleaning surfaces the patient has come in contact with.  Use a diluted bleach solution (e.g., dilute bleach with 1 part  bleach and 10 parts water) or a household disinfectant with a label that says EPA-registered for coronaviruses. To make a bleach solution at home, add 1 tablespoon of bleach to 1 quart (4 cups) of water. For a larger supply, add  cup of bleach to 1 gallon (16 cups) of water.  Read labels of cleaning products and follow recommendations provided on product labels. Labels contain instructions for safe and effective use of the cleaning product including precautions you should take when applying the product, such as wearing gloves or eye protection  and making sure you have good ventilation during use of the product.  Remove gloves and wash hands immediately after cleaning.  Monitor yourself for signs and symptoms of illness Caregivers and household members are considered close contacts, should monitor their health, and will be asked to limit movement outside of the home to the extent possible. Follow the monitoring steps for close contacts listed on the symptom monitoring form.   ? If you have additional questions, contact your local health department or call the epidemiologist on call at [phone] (available 24/7). ? This guidance is subject to change. For the most up-to-date guidance from [facility], please refer to their website: YouBlogs.pl

## 2019-12-19 NOTE — Discharge Summary (Signed)
Physician Discharge Summary  Eddie Irwin [facility]:[id number] DOB: 01-Jul-1961 DOA: 12/13/2019  PCP: Catha Nottingham, FNP  Admit date: 12/13/2019 Discharge date: 12/19/2019  Admitted From: Home Disposition: Home  Recommendations for Outpatient Follow-up:  1. Follow up with PCP in 1-2 weeks 2. Please obtain BMP/CBC in one week  Home Health: Equipment/Devices: Oxygen at 3L/min  Discharge Condition: Stable CODE STATUS: Full code Diet recommendation: Heart healthy, carb modified  Brief/Interim Summary: MOQ:HUTMLYY Eddie Irwin a 58 y.o.malewith medical history significant ofdiabetes mellitus type 2, hypertension, hyperlipidemia, BPH who presented to the ER with shortness of breath.Patient states he was diagnosed with COVID-19 about 7 days ago. His wife is also tested positive. He says over the last 2 days he has been having increased shortness of breath, weakness, fatigue, generalized myalgias. He has not been taking his insulin due to feeling sick. He is not also checked his blood sugars. He is also associated nausea, dry cough. Denies any fever, chills ED Course: Vital Signs reviewed on presentation, significant fortemperature 98.6, heart rate 109, blood pressure 123/65, saturation 92% on room air. Labs reviewed, significant forsodium 130, potassium 5.1, chloride 91, CO2 11, glucose 320, BUN 31, creatinine 1.81, WBC count 7.3, hemoglobin 14.0, hematocrit 45, platelets 202. Imaging personally Reviewed,chest x-ray shows bilateral patchy opacities consistent with COVID-19.  Discharge Diagnoses:  Principal Problem:   DKA (diabetic ketoacidoses) (HCC) Active Problems:   Obesity, unspecified   Chronic pain syndrome   COVID-19 virus infection   HTN (hypertension)   HLD (hyperlipidemia)  1. Diabetic ketoacidosis in a type II diabetic.  Patient had uncontrolled diabetes with hyperglycemia.  He is chronically on Lantus, Humalog, Synjardy at home.  He reports compliance with  medications, but since contracting COVID-19 reports that he had been having trouble thinking clearly and may have forgotten to take his medications.  He was treated with intravenous insulin and IV fluids.  Overall anion gap improved and he was transitioned to Lantus.  Blood sugars have been stable.  He will be resumed on his home regimen on discharge. 2. Acute respiratory failure with hypoxia secondary to COVID-19 pneumonia.  Patient noted to have chest x-ray findings consistent with COVID-19 infection.  He was requiring up to 6L oxygen.  Breathing appears to be nonlabored.    He was treated with course of remdesivir and dexamethasone.  He also received a dose of Actemra on 1/8.    Oxygen has been weaned down to 3 L and he is able to ambulate without any significant dyspnea.  Plan will be to discharge home with supplemental oxygen.  Overall fevers have resolved. 3. Acute renal failure.  Creatinine 1.8 on presentation.  This is improved to the normal range with IV hydration.  Continue to follow urine output.  Hold ACE inhibitor. 4. Hypertension.  Blood pressure currently stable.  Holding ACE inhibitor.  This can be resumed as an outpatient as felt appropriate. 5. Hyperlipidemia.  Continue Lipitor 6. BPH.  Continue on tamsulosin  Discharge Instructions  Discharge Instructions    Diet - low sodium heart healthy   Complete by: As directed    Increase activity slowly   Complete by: As directed    MyChart COVID-19 home monitoring program   Complete by: Dec 19, 2019    Is the patient willing to use the MyChart Mobile App for home monitoring?: Yes   Temperature monitoring   Complete by: Dec 19, 2019    After how many days would you like to receive a notification of  this patient's flowsheet entries?: 1     Allergies as of 12/19/2019   No Known Allergies     Medication List    STOP taking these medications   diclofenac 75 MG EC tablet Commonly known as: VOLTAREN     TAKE these medications    albuterol 108 (90 Base) MCG/ACT inhaler Commonly known as: VENTOLIN HFA Inhale 2 puffs into the lungs every 4 (four) hours as needed for wheezing or shortness of breath.   aspirin 81 MG tablet Take 81 mg by mouth daily.   atorvastatin 10 MG tablet Commonly known as: LIPITOR Take 10 mg by mouth daily.   dexamethasone 6 MG tablet Commonly known as: DECADRON Take 1 tablet (6 mg total) by mouth daily for 4 days.   fenofibrate 145 MG tablet Commonly known as: TRICOR Take 145 mg by mouth daily.   fluticasone 50 MCG/ACT nasal spray Commonly known as: FLONASE Place into both nostrils daily.   gabapentin 600 MG tablet Commonly known as: NEURONTIN Take 1,200 mg by mouth 2 (two) times daily.   guaiFENesin-dextromethorphan 100-10 MG/5ML syrup Commonly known as: ROBITUSSIN DM Take 10 mLs by mouth every 4 (four) hours as needed for cough.   HumaLOG KwikPen 100 UNIT/ML KwikPen Generic drug: insulin lispro Inject 0.5 mLs (50 Units total) into the skin 3 (three) times daily. What changed: how to take this   Lantus SoloStar 100 UNIT/ML Solostar Pen Generic drug: Insulin Glargine Inject 75 Units into the skin at bedtime. What changed: how to take this   lisinopril 10 MG tablet Commonly known as: ZESTRIL Take 10 mg by mouth daily.   oxyCODONE 15 MG immediate release tablet Commonly known as: ROXICODONE Take 15 mg by mouth every 4 (four) hours.   Synjardy 12.04-999 MG Tabs Generic drug: Empagliflozin-metFORMIN HCl Take 1 tablet by mouth 2 (two) times daily.   tamsulosin 0.4 MG Caps capsule Commonly known as: FLOMAX Take 0.4 mg by mouth daily.   topiramate 25 MG tablet Commonly known as: TOPAMAX Take 25 mg by mouth 2 (two) times daily.            Durable Medical Equipment  (From admission, onward)         Start     Ordered   12/19/19 1315  For home use only DME 3 n 1  Once     12/19/19 1314   12/19/19 1315  For home use only DME oxygen  Once    Question Answer  Comment  Length of Need 6 Months   Mode or (Route) Nasal cannula   Liters per Minute 3   Frequency Continuous (stationary and portable oxygen unit needed)   Oxygen conserving device Yes   Oxygen delivery system Gas      12/19/19 1314   12/19/19 1152  For home use only DME oxygen  Once    Question Answer Comment  Length of Need 6 Months   Mode or (Route) Nasal cannula   Liters per Minute 4   Frequency Continuous (stationary and portable oxygen unit needed)   Oxygen conserving device Yes   Oxygen delivery system Gas      12/19/19 1151          No Known Allergies  Consultations:     Procedures/Studies: DG Chest Port 1 View  Result Date: 12/13/2019 CLINICAL DATA:  Shortness of breath for 2 days, history of COVID-19 positivity EXAM: PORTABLE CHEST 1 VIEW COMPARISON:  04/23/2019 FINDINGS: Cardiac shadow is stable. The lungs are well  aerated bilaterally. Patchy infiltrates are seen bilaterally consistent with the given clinical history of COVID-19 positivity. No bony abnormality is seen. IMPRESSION: Patchy opacities consistent with the given clinical history. Electronically Signed   By: Alcide Clever M.D.   On: 12/13/2019 20:28       Subjective: Feels that shortness of breath is improving.  Able to ambulate without any dyspnea.  Minimal cough.  Discharge Exam: Vitals:   12/19/19 0400 12/19/19 0428 12/19/19 0735 12/19/19 1610  BP:  140/78  124/71  Pulse:  73  89  Resp:    19  Temp: 98.1 F (36.7 C)  98.2 F (36.8 C) 98.5 F (36.9 C)  TempSrc: Oral  Oral Oral  SpO2:  93%  94%  Weight: 124.4 kg     Height:        General: Pt is alert, awake, not in acute distress Cardiovascular: RRR, S1/S2 +, no rubs, no gallops Respiratory: CTA bilaterally, no wheezing, no rhonchi Abdominal: Soft, NT, ND, bowel sounds + Extremities: no edema, no cyanosis    The results of significant diagnostics from this hospitalization (including imaging, microbiology, ancillary and  laboratory) are listed below for reference.     Microbiology: Recent Results (from the past 240 hour(s))  Respiratory Panel by RT PCR (Flu A&B, Covid) - Nasopharyngeal Swab     Status: Abnormal   Collection Time: 12/13/19 11:49 PM   Specimen: Nasopharyngeal Swab  Result Value Ref Range Status   SARS Coronavirus 2 by RT PCR POSITIVE (A) NEGATIVE Final    Comment: RESULT CALLED TO, READ BACK BY AND VERIFIED WITH: WALKER,T. AT 0102 ON 12/14/2019 BY EVA (NOTE) SARS-CoV-2 target nucleic acids are DETECTED. SARS-CoV-2 RNA is generally detectable in upper respiratory specimens  during the acute phase of infection. Positive results are indicative of the presence of the identified virus, but do not rule out bacterial infection or co-infection with other pathogens not detected by the test. Clinical correlation with patient history and other diagnostic information is necessary to determine patient infection status. The expected result is Negative. Fact Sheet for Patients:  https://www.moore.com/ Fact Sheet for Healthcare Providers: https://www.young.biz/ This test is not yet approved or cleared by the [place] FDA and  has been authorized for detection and/or diagnosis of SARS-CoV-2 by FDA under an Emergency Use Authorization (EUA).  This EUA will remain in effect (meaning this test can be used ) for the duration of  the COVID-19 declaration under Section 564(b)(1) of the Act, 21 U.S.C. section 360bbb-3(b)(1), unless the authorization is terminated or revoked sooner.    Influenza A by PCR NEGATIVE NEGATIVE Final   Influenza B by PCR NEGATIVE NEGATIVE Final    Comment: (NOTE) The Xpert Xpress SARS-CoV-2/FLU/RSV assay is intended as an aid in  the diagnosis of influenza from Nasopharyngeal swab specimens and  should not be used as a sole basis for treatment. Nasal washings and  aspirates are unacceptable for Xpert Xpress SARS-CoV-2/FLU/RSV   testing. Fact Sheet for Patients: https://www.moore.com/ Fact Sheet for Healthcare Providers: https://www.young.biz/ This test is not yet approved or cleared by the [place] FDA and  has been authorized for detection and/or diagnosis of SARS-CoV-2 by  FDA under an Emergency Use Authorization (EUA). This EUA will remain  in effect (meaning this test can be used) for the duration of the  Covid-19 declaration under Section 564(b)(1) of the Act, 21  U.S.C. section 360bbb-3(b)(1), unless the authorization is  terminated or revoked. Performed at [facility], [address],  [place], [place] [address]   MRSA PCR Screening     Status: None   Collection Time: 12/14/19  9:59 PM   Specimen: Nasal Mucosa; Nasopharyngeal  Result Value Ref Range Status   MRSA by PCR NEGATIVE NEGATIVE Final    Comment:        The GeneXpert MRSA Assay (FDA approved for NASAL specimens only), is one component of a comprehensive MRSA colonization surveillance program. It is not intended to diagnose MRSA infection nor to guide or monitor treatment for MRSA infections. Performed at [facility], [address], [place], [place] [address]      Labs: BNP (last 3 results) No results for input(s): BNP in the last 8760 hours. Basic Metabolic Panel: Recent Labs  Lab 12/15/19 1654 12/16/19 0508 12/17/19 0537 12/18/19 0435 12/19/19 0437  NA 135 135 139 139 139  K 5.0 3.6 3.8 3.6 3.7  CL 101 102 99 97* 95*  CO2 18* 23 32 29 31  GLUCOSE 243* 140* 119* 185* 216*  BUN 23* 19 16 16 20   CREATININE 1.18 0.97 0.83 0.77 0.93  CALCIUM 8.3* 8.1* 8.7* 9.0 9.3  PHOS 2.0*  --   --   --   --    Liver Function Tests: Recent Labs  Lab 12/15/19 1654 12/16/19 0508  AST  --  26  ALT  --  20  ALKPHOS  --  48  BILITOT  --  0.7  PROT  --  5.8*  ALBUMIN 2.9* 2.6*   No results for input(s): LIPASE, AMYLASE in the last 168 hours. No results for input(s): AMMONIA in the last  168 hours. CBC: Recent Labs  Lab 12/13/19 2120  WBC 7.3  NEUTROABS 5.9  HGB 14.0  HCT 45.3  MCV 96.8  PLT 202   Cardiac Enzymes: No results for input(s): CKTOTAL, CKMB, CKMBINDEX, TROPONINI in the last 168 hours. BNP: Invalid input(s): POCBNP CBG: Recent Labs  Lab 12/18/19 1718 12/18/19 2109 12/19/19 0732 12/19/19 1059 12/19/19 1606  GLUCAP 259* 304* 211* 312* 377*   D-Dimer Recent Labs    12/17/19 0537 12/18/19 0435  DDIMER 1.37* 1.24*   Hgb A1c No results for input(s): HGBA1C in the last 72 hours. Lipid Profile No results for input(s): CHOL, HDL, LDLCALC, TRIG, CHOLHDL, LDLDIRECT in the last 72 hours. Thyroid function studies No results for input(s): TSH, T4TOTAL, T3FREE, THYROIDAB in the last 72 hours.  Invalid input(s): FREET3 Anemia work up Recent Labs    12/17/19 0537 12/18/19 0435  FERRITIN 491* 443*   Urinalysis    Component Value Date/Time   COLORURINE YELLOW 12/13/2019 2232   APPEARANCEUR CLEAR 12/13/2019 2232   LABSPEC 1.017 12/13/2019 2232   PHURINE 5.0 12/13/2019 2232   GLUCOSEU >=500 (A) 12/13/2019 2232   HGBUR NEGATIVE 12/13/2019 2232   BILIRUBINUR NEGATIVE 12/13/2019 2232   KETONESUR 80 (A) 12/13/2019 2232   PROTEINUR NEGATIVE 12/13/2019 2232   NITRITE NEGATIVE 12/13/2019 2232   LEUKOCYTESUR NEGATIVE 12/13/2019 2232   Sepsis Labs Invalid input(s): PROCALCITONIN,  WBC,  LACTICIDVEN Microbiology Recent Results (from the past 240 hour(s))  Respiratory Panel by RT PCR (Flu A&B, Covid) - Nasopharyngeal Swab     Status: Abnormal   Collection Time: 12/13/19 11:49 PM   Specimen: Nasopharyngeal Swab  Result Value Ref Range Status   SARS Coronavirus 2 by RT PCR POSITIVE (A) NEGATIVE Final    Comment: RESULT CALLED TO, READ BACK BY AND VERIFIED WITH: WALKER,T. AT 0102 ON 12/14/2019 BY EVA (NOTE) SARS-CoV-2 target nucleic acids  are DETECTED. SARS-CoV-2 RNA is generally detectable in upper respiratory specimens  during the acute phase of  infection. Positive results are indicative of the presence of the identified virus, but do not rule out bacterial infection or co-infection with other pathogens not detected by the test. Clinical correlation with patient history and other diagnostic information is necessary to determine patient infection status. The expected result is Negative. Fact Sheet for Patients:  https://www.moore.com/ Fact Sheet for Healthcare Providers: https://www.young.biz/ This test is not yet approved or cleared by the [place] FDA and  has been authorized for detection and/or diagnosis of SARS-CoV-2 by FDA under an Emergency Use Authorization (EUA).  This EUA will remain in effect (meaning this test can be used ) for the duration of  the COVID-19 declaration under Section 564(b)(1) of the Act, 21 U.S.C. section 360bbb-3(b)(1), unless the authorization is terminated or revoked sooner.    Influenza A by PCR NEGATIVE NEGATIVE Final   Influenza B by PCR NEGATIVE NEGATIVE Final    Comment: (NOTE) The Xpert Xpress SARS-CoV-2/FLU/RSV assay is intended as an aid in  the diagnosis of influenza from Nasopharyngeal swab specimens and  should not be used as a sole basis for treatment. Nasal washings and  aspirates are unacceptable for Xpert Xpress SARS-CoV-2/FLU/RSV  testing. Fact Sheet for Patients: https://www.moore.com/ Fact Sheet for Healthcare Providers: https://www.young.biz/ This test is not yet approved or cleared by the [place] FDA and  has been authorized for detection and/or diagnosis of SARS-CoV-2 by  FDA under an Emergency Use Authorization (EUA). This EUA will remain  in effect (meaning this test can be used) for the duration of the  Covid-19 declaration under Section 564(b)(1) of the Act, 21  U.S.C. section 360bbb-3(b)(1), unless the authorization is  terminated or revoked. Performed at [facility], [address] [place], [place] [address]   MRSA PCR Screening     Status: None   Collection Time: 12/14/19  9:59 PM   Specimen: Nasal Mucosa; Nasopharyngeal  Result Value Ref Range Status   MRSA by PCR NEGATIVE NEGATIVE Final    Comment:        The GeneXpert MRSA Assay (FDA approved for NASAL specimens only), is one component of a comprehensive MRSA colonization surveillance program. It is not intended to diagnose MRSA infection nor to guide or monitor treatment for MRSA infections. Performed at [facility], [address], [place], [place] [address]      Time coordinating discharge:  SIGNED:   Erick Blinks, MD  [facility] 12/19/2019, 8:21 PM   If 7PM-7AM, please contact night-coverage [email or web address]

## 2020-08-09 ENCOUNTER — Ambulatory Visit (INDEPENDENT_AMBULATORY_CARE_PROVIDER_SITE_OTHER): Payer: Medicare Other | Admitting: General Surgery

## 2020-08-09 ENCOUNTER — Other Ambulatory Visit: Payer: Self-pay

## 2020-08-09 ENCOUNTER — Encounter: Payer: Self-pay | Admitting: General Surgery

## 2020-08-09 VITALS — BP 115/79 | HR 71 | Temp 98.1°F | Resp 16 | Ht 72.0 in | Wt 299.0 lb

## 2020-08-09 DIAGNOSIS — Z1211 Encounter for screening for malignant neoplasm of colon: Secondary | ICD-10-CM

## 2020-08-09 MED ORDER — SUPREP BOWEL PREP KIT 17.5-3.13-1.6 GM/177ML PO SOLN: 1.0000 | Freq: Once | ORAL | 0 refills | Status: AC

## 2020-08-09 NOTE — H&P (Signed)
Eddie Irwin; [id number]; 03/25/1961   HPI Patient is a 59-year-old white male who was referred to my care by Joanna Carter for a screening colonoscopy.  He last had a colonoscopy 10 years ago.  He denies any abdominal pain, weight loss, blood per rectum, abnormal diarrhea or constipation, or family history of colon cancer.  He had no problems with his last colonoscopy. Past Medical History:  Diagnosis Date  . Colon polyp   . Diabetes mellitus   . Herniated disc   . Hyperlipidemia   . Joint pain     History reviewed. No pertinent surgical history.  Family History  Problem Relation Age of Onset  . Heart attack Brother 49       cocaine use  . Heart attack Mother 60    Current Outpatient Medications on File Prior to Visit  Medication Sig Dispense Refill  . aspirin 81 MG tablet Take 81 mg by mouth daily.    . atorvastatin (LIPITOR) 10 MG tablet Take 10 mg by mouth daily.    . fenofibrate (TRICOR) 145 MG tablet Take 145 mg by mouth daily.    . fluticasone (FLONASE) 50 MCG/ACT nasal spray Place into both nostrils daily.     . gabapentin (NEURONTIN) 600 MG tablet Take 1,200 mg by mouth 2 (two) times daily.    . glipiZIDE (GLUCOTROL) 10 MG tablet Take 10 mg by mouth 2 (two) times daily.    . HUMALOG KWIKPEN 100 UNIT/ML KwikPen Inject 0.5 mLs (50 Units total) into the skin 3 (three) times daily. 15 mL 11  . LANTUS SOLOSTAR 100 UNIT/ML Solostar Pen Inject 75 Units into the skin at bedtime. 15 mL 11  . lisinopril (PRINIVIL,ZESTRIL) 10 MG tablet Take 10 mg by mouth daily.    . oxyCODONE (ROXICODONE) 15 MG immediate release tablet Take 15 mg by mouth every 4 (four) hours.    . SYNJARDY 12.04-999 MG TABS Take 1 tablet by mouth 2 (two) times daily.    . Tamsulosin HCl (FLOMAX) 0.4 MG CAPS Take 0.4 mg by mouth daily.    . topiramate (TOPAMAX) 25 MG tablet Take 25 mg by mouth 2 (two) times daily.     No current facility-administered medications on file prior to visit.    No Known  Allergies  Social History   Substance and Sexual Activity  Alcohol Use Yes   Comment: RARE    Social History   Tobacco Use  Smoking Status Former Smoker  Smokeless Tobacco Never Used    Review of Systems  Constitutional: Negative.   HENT: Negative.   Eyes: Negative.   Respiratory: Negative.   Cardiovascular: Negative.   Gastrointestinal: Negative.   Genitourinary: Negative.   Musculoskeletal: Positive for back pain, joint pain and neck pain.  Skin: Negative.   Neurological: Negative.   Endo/Heme/Allergies: Negative.   Psychiatric/Behavioral: Negative.     Objective   Vitals:   08/09/20 1043  BP: 115/79  Pulse: 71  Resp: 16  Temp: 98.1 F (36.7 C)  SpO2: 97%    Physical Exam Vitals reviewed.  Constitutional:      Appearance: Normal appearance. He is obese. He is not ill-appearing.  HENT:     Head: Normocephalic and atraumatic.  Cardiovascular:     Rate and Rhythm: Normal rate and regular rhythm.     Heart sounds: Normal heart sounds. No murmur heard.  No friction rub. No gallop.   Pulmonary:     Effort: Pulmonary effort is normal. No respiratory distress.       Breath sounds: Normal breath sounds. No stridor. No wheezing, rhonchi or rales.  Abdominal:     General: Bowel sounds are normal. There is no distension.     Palpations: Abdomen is soft. There is no mass.     Tenderness: There is no abdominal tenderness. There is no guarding or rebound.     Hernia: No hernia is present.  Skin:    General: Skin is warm and dry.  Neurological:     Mental Status: He is alert and oriented to person, place, and time.     Assessment  Need for screening colonoscopy Plan   Patient is scheduled for a screening colonoscopy on 08/21/2020.  The risks and benefits of the procedure including bleeding and perforation were fully explained to the patient, who gave informed consent.  Suprep has been prescribed. 

## 2020-08-09 NOTE — Patient Instructions (Signed)
Colonoscopy, Adult A colonoscopy is a procedure to look at the entire large intestine. This procedure is done using a long, thin, flexible tube that has a camera on the end. You may have a colonoscopy:  As a part of normal colorectal screening.  If you have certain symptoms, such as: ? A low number of red blood cells in your blood (anemia). ? Diarrhea that does not go away. ? Pain in your abdomen. ? Blood in your stool. A colonoscopy can help screen for and diagnose medical problems, including:  Tumors.  Extra tissue that grows where mucus forms (polyps).  Inflammation.  Areas of bleeding. Tell your health care provider about:  Any allergies you have.  All medicines you are taking, including vitamins, herbs, eye drops, creams, and over-the-counter medicines.  Any problems you or family members have had with anesthetic medicines.  Any blood disorders you have.  Any surgeries you have had.  Any medical conditions you have.  Any problems you have had with having bowel movements.  Whether you are pregnant or may be pregnant. What are the risks? Generally, this is a safe procedure. However, problems may occur, including:  Bleeding.  Damage to your intestine.  Allergic reactions to medicines given during the procedure.  Infection. This is rare. What happens before the procedure? Eating and drinking restrictions Follow instructions from your health care provider about eating or drinking restrictions, which may include:  A few days before the procedure: ? Follow a low-fiber diet. ? Avoid nuts, seeds, dried fruit, raw fruits, and vegetables.  1-3 days before the procedure: ? Eat only gelatin dessert or ice pops. ? Drink only clear liquids, such as water, clear juice, clear broth or bouillon, black coffee or tea, or clear soft drinks or sports drinks. ? Avoid liquids that contain red or purple dye.  The day of the procedure: ? Do not eat solid foods. You may  continue to drink clear liquids until up to 2 hours before the procedure. ? Do not eat or drink anything starting 2 hours before the procedure, or within the time period that your health care provider recommends. Bowel prep If you were prescribed a bowel prep to take by mouth (orally) to clean out your colon:  Take it as told by your health care provider. Starting the day before your procedure, you will need to drink a large amount of liquid medicine. The liquid will cause you to have many bowel movements of loose stool until your stool becomes almost clear or light green.  If your skin or the opening between the buttocks (anus) gets irritated from diarrhea, you may relieve the irritation using: ? Wipes with medicine in them, such as adult wet wipes with aloe and vitamin E. ? A product to soothe skin, such as petroleum jelly.  If you vomit while drinking the bowel prep: ? Take a break for up to 60 minutes. ? Begin the bowel prep again. ? Call your health care provider if you keep vomiting or you cannot take the bowel prep without vomiting.  To clean out your colon, you may also be given: ? Laxative medicines. These help you have a bowel movement. ? Instructions for enema use. An enema is liquid medicine injected into your rectum. Medicines Ask your health care provider about:  Changing or stopping your regular medicines or supplements. This is especially important if you are taking iron supplements, diabetes medicines, or blood thinners.  Taking medicines such as aspirin and ibuprofen. These medicines   can thin your blood. Do not take these medicines unless your health care provider tells you to take them.  Taking over-the-counter medicines, vitamins, herbs, and supplements. General instructions  Ask your health care provider what steps will be taken to help prevent infection. These may include washing skin with a germ-killing soap.  Plan to have someone take you home from the hospital  or clinic. What happens during the procedure?   An IV will be inserted into one of your veins.  You may be given one or more of the following: ? A medicine to help you relax (sedative). ? A medicine to numb the area (local anesthetic). ? A medicine to make you fall asleep (general anesthetic). This is rarely needed.  You will lie on your side with your knees bent.  The tube will: ? Have oil or gel put on it (be lubricated). ? Be inserted into your anus. ? Be gently eased through all parts of your large intestine.  Air will be sent into your colon to keep it open. This may cause some pressure or cramping.  Images will be taken with the camera and will appear on a screen.  A small tissue sample may be removed to be looked at under a microscope (biopsy). The tissue may be sent to a lab for testing if any signs of problems are found.  If small polyps are found, they may be removed and checked for cancer cells.  When the procedure is finished, the tube will be removed. The procedure may vary among health care providers and hospitals. What happens after the procedure?  Your blood pressure, heart rate, breathing rate, and blood oxygen level will be monitored until you leave the hospital or clinic.  You may have a small amount of blood in your stool.  You may pass gas and have mild cramping or bloating in your abdomen. This is caused by the air that was used to open your colon during the exam.  Do not drive for 24 hours after the procedure.  It is up to you to get the results of your procedure. Ask your health care provider, or the department that is doing the procedure, when your results will be ready. Summary  A colonoscopy is a procedure to look at the entire large intestine.  Follow instructions from your health care provider about eating and drinking before the procedure.  If you were prescribed an oral bowel prep to clean out your colon, take it as told by your health care  provider.  During the colonoscopy, a flexible tube with a camera on its end is inserted into the anus and then passed into the other parts of the large intestine. This information is not intended to replace advice given to you by your health care provider. Make sure you discuss any questions you have with your health care provider. Document Revised: 06/17/2019 Document Reviewed: 06/17/2019 Elsevier Patient Education  2020 Elsevier Inc.  

## 2020-08-09 NOTE — Progress Notes (Signed)
Eddie Irwin; [id number]; 09-24-1961   HPI Patient is a 59 year old white male who was referred to my care by Eddie Irwin for a screening colonoscopy.  He last had a colonoscopy 10 years ago.  He denies any abdominal pain, weight loss, blood per rectum, abnormal diarrhea or constipation, or family history of colon cancer.  He had no problems with his last colonoscopy. Past Medical History:  Diagnosis Date  . Colon polyp   . Diabetes mellitus   . Herniated disc   . Hyperlipidemia   . Joint pain     History reviewed. No pertinent surgical history.  Family History  Problem Relation Age of Onset  . Heart attack Brother 49       cocaine use  . Heart attack Mother 39    Current Outpatient Medications on File Prior to Visit  Medication Sig Dispense Refill  . aspirin 81 MG tablet Take 81 mg by mouth daily.    Eddie Irwin Kitchen atorvastatin (LIPITOR) 10 MG tablet Take 10 mg by mouth daily.    . fenofibrate (TRICOR) 145 MG tablet Take 145 mg by mouth daily.    . fluticasone (FLONASE) 50 MCG/ACT nasal spray Place into both nostrils daily.     Eddie Irwin Kitchen gabapentin (NEURONTIN) 600 MG tablet Take 1,200 mg by mouth 2 (two) times daily.    Eddie Irwin Kitchen glipiZIDE (GLUCOTROL) 10 MG tablet Take 10 mg by mouth 2 (two) times daily.    Eddie Irwin Kitchen HUMALOG KWIKPEN 100 UNIT/ML KwikPen Inject 0.5 mLs (50 Units total) into the skin 3 (three) times daily. 15 mL 11  . LANTUS SOLOSTAR 100 UNIT/ML Solostar Pen Inject 75 Units into the skin at bedtime. 15 mL 11  . lisinopril (PRINIVIL,ZESTRIL) 10 MG tablet Take 10 mg by mouth daily.    Eddie Irwin Kitchen oxyCODONE (ROXICODONE) 15 MG immediate release tablet Take 15 mg by mouth every 4 (four) hours.    Eddie Irwin Kitchen SYNJARDY 12.04-999 MG TABS Take 1 tablet by mouth 2 (two) times daily.    . Tamsulosin HCl (FLOMAX) 0.4 MG CAPS Take 0.4 mg by mouth daily.    Eddie Irwin Kitchen topiramate (TOPAMAX) 25 MG tablet Take 25 mg by mouth 2 (two) times daily.     No current facility-administered medications on file prior to visit.    No Known  Allergies  Social History   Substance and Sexual Activity  Alcohol Use Yes   Comment: RARE    Social History   Tobacco Use  Smoking Status Former Smoker  Smokeless Tobacco Never Used    Review of Systems  Constitutional: Negative.   HENT: Negative.   Eyes: Negative.   Respiratory: Negative.   Cardiovascular: Negative.   Gastrointestinal: Negative.   Genitourinary: Negative.   Musculoskeletal: Positive for back pain, joint pain and neck pain.  Skin: Negative.   Neurological: Negative.   Endo/Heme/Allergies: Negative.   Psychiatric/Behavioral: Negative.     Objective   Vitals:   08/09/20 1043  BP: 115/79  Pulse: 71  Resp: 16  Temp: 98.1 F (36.7 C)  SpO2: 97%    Physical Exam Vitals reviewed.  Constitutional:      Appearance: Normal appearance. He is obese. He is not ill-appearing.  HENT:     Head: Normocephalic and atraumatic.  Cardiovascular:     Rate and Rhythm: Normal rate and regular rhythm.     Heart sounds: Normal heart sounds. No murmur heard.  No friction rub. No gallop.   Pulmonary:     Effort: Pulmonary effort is normal. No respiratory distress.  Breath sounds: Normal breath sounds. No stridor. No wheezing, rhonchi or rales.  Abdominal:     General: Bowel sounds are normal. There is no distension.     Palpations: Abdomen is soft. There is no mass.     Tenderness: There is no abdominal tenderness. There is no guarding or rebound.     Hernia: No hernia is present.  Skin:    General: Skin is warm and dry.  Neurological:     Mental Status: He is alert and oriented to person, place, and time.     Assessment  Need for screening colonoscopy Plan   Patient is scheduled for a screening colonoscopy on 08/21/2020.  The risks and benefits of the procedure including bleeding and perforation were fully explained to the patient, who gave informed consent.  Suprep has been prescribed.

## 2020-08-20 ENCOUNTER — Other Ambulatory Visit: Payer: Self-pay

## 2020-08-20 ENCOUNTER — Other Ambulatory Visit (HOSPITAL_COMMUNITY)
Admission: RE | Admit: 2020-08-20 | Discharge: 2020-08-20 | Disposition: A | Discharge status: Home / Self Care | Payer: Medicare Other | Source: Ambulatory Visit | Attending: General Surgery | Admitting: General Surgery

## 2020-08-20 DIAGNOSIS — Z20822 Contact with and (suspected) exposure to covid-19: Secondary | ICD-10-CM | POA: Insufficient documentation

## 2020-08-20 DIAGNOSIS — Z01812 Encounter for preprocedural laboratory examination: Secondary | ICD-10-CM | POA: Insufficient documentation

## 2020-08-20 LAB — SARS CORONAVIRUS 2 (TAT 6-24 HRS): SARS Coronavirus 2: NEGATIVE

## 2020-08-21 ENCOUNTER — Encounter (HOSPITAL_COMMUNITY): Payer: Self-pay | Admitting: General Surgery

## 2020-08-21 ENCOUNTER — Encounter (HOSPITAL_COMMUNITY)
Admission: RE | Disposition: A | Discharge status: Home / Self Care | Payer: Self-pay | Source: Home / Self Care | Attending: General Surgery

## 2020-08-21 ENCOUNTER — Ambulatory Visit (HOSPITAL_COMMUNITY)
Admission: RE | Admit: 2020-08-21 | Discharge: 2020-08-21 | Disposition: A | Discharge status: Home / Self Care | Payer: Medicare Other | Attending: General Surgery | Admitting: General Surgery

## 2020-08-21 ENCOUNTER — Other Ambulatory Visit: Payer: Self-pay

## 2020-08-21 DIAGNOSIS — Z8249 Family history of ischemic heart disease and other diseases of the circulatory system: Secondary | ICD-10-CM | POA: Insufficient documentation

## 2020-08-21 DIAGNOSIS — Z79899 Other long term (current) drug therapy: Secondary | ICD-10-CM | POA: Diagnosis not present

## 2020-08-21 DIAGNOSIS — K648 Other hemorrhoids: Secondary | ICD-10-CM | POA: Diagnosis not present

## 2020-08-21 DIAGNOSIS — K649 Unspecified hemorrhoids: Secondary | ICD-10-CM | POA: Insufficient documentation

## 2020-08-21 DIAGNOSIS — Z794 Long term (current) use of insulin: Secondary | ICD-10-CM | POA: Diagnosis not present

## 2020-08-21 DIAGNOSIS — Z1211 Encounter for screening for malignant neoplasm of colon: Secondary | ICD-10-CM | POA: Diagnosis not present

## 2020-08-21 DIAGNOSIS — Z87891 Personal history of nicotine dependence: Secondary | ICD-10-CM | POA: Diagnosis not present

## 2020-08-21 DIAGNOSIS — Z86010 Personal history of colonic polyps: Secondary | ICD-10-CM | POA: Diagnosis not present

## 2020-08-21 DIAGNOSIS — E785 Hyperlipidemia, unspecified: Secondary | ICD-10-CM | POA: Insufficient documentation

## 2020-08-21 DIAGNOSIS — Z7982 Long term (current) use of aspirin: Secondary | ICD-10-CM | POA: Insufficient documentation

## 2020-08-21 DIAGNOSIS — E119 Type 2 diabetes mellitus without complications: Secondary | ICD-10-CM | POA: Insufficient documentation

## 2020-08-21 HISTORY — PX: COLONOSCOPY: SHX5424

## 2020-08-21 LAB — GLUCOSE, CAPILLARY: Glucose-Capillary: 192 mg/dL — ABNORMAL HIGH (ref 70–99)

## 2020-08-21 SURGERY — COLONOSCOPY
Anesthesia: Moderate Sedation

## 2020-08-21 MED ORDER — SODIUM CHLORIDE 0.9 % IV SOLN: INTRAVENOUS | Status: DC

## 2020-08-21 MED ORDER — STERILE WATER FOR IRRIGATION IR SOLN
Status: DC | PRN
  Administered 2020-08-21: 1.5 mL

## 2020-08-21 MED ORDER — MEPERIDINE HCL 50 MG/ML IJ SOLN
INTRAMUSCULAR | Status: DC | PRN
Start: 2020-08-21 — End: 2020-08-21
  Administered 2020-08-21: 50 mg via INTRAVENOUS

## 2020-08-21 MED ORDER — MIDAZOLAM HCL 5 MG/5ML IJ SOLN
INTRAMUSCULAR | Status: AC
  Filled 2020-08-21: qty 10

## 2020-08-21 MED ORDER — MEPERIDINE HCL 50 MG/ML IJ SOLN
INTRAMUSCULAR | Status: AC
  Filled 2020-08-21: qty 1

## 2020-08-21 MED ORDER — MIDAZOLAM HCL 5 MG/5ML IJ SOLN
INTRAMUSCULAR | Status: DC | PRN
  Administered 2020-08-21: 2 mg via INTRAVENOUS

## 2020-08-21 NOTE — Discharge Instructions (Signed)
Colonoscopy, Adult, Care After This sheet gives you information about how to care for yourself after your procedure. Your health care provider may also give you more specific instructions. If you have problems or questions, contact your health care provider. What can I expect after the procedure? After the procedure, it is common to have:  A small amount of blood in your stool for 24 hours after the procedure.  Some gas.  Mild cramping or bloating of your abdomen. Follow these instructions at home: Eating and drinking   Drink enough fluid to keep your urine pale yellow.  Follow instructions from your health care provider about eating or drinking restrictions.  Resume your normal diet as instructed by your health care provider. Avoid heavy or fried foods that are hard to digest. Activity  Rest as told by your health care provider.  Avoid sitting for a long time without moving. Get up to take short walks every 1-2 hours. This is important to improve blood flow and breathing. Ask for help if you feel weak or unsteady.  Return to your normal activities as told by your health care provider. Ask your health care provider what activities are safe for you. Managing cramping and bloating   Try walking around when you have cramps or feel bloated.  Apply heat to your abdomen as told by your health care provider. Use the heat source that your health care provider recommends, such as a moist heat pack or a heating pad. ? Place a towel between your skin and the heat source. ? Leave the heat on for 20-30 minutes. ? Remove the heat if your skin turns bright red. This is especially important if you are unable to feel pain, heat, or cold. You may have a greater risk of getting burned. General instructions  For the first 24 hours after the procedure: ? Do not drive or use machinery. ? Do not sign important documents. ? Do not drink alcohol. ? Do your regular daily activities at a slower pace  than normal. ? Eat soft foods that are easy to digest.  Take over-the-counter and prescription medicines only as told by your health care provider.  Keep all follow-up visits as told by your health care provider. This is important. Contact a health care provider if:  You have blood in your stool 2-3 days after the procedure. Get help right away if you have:  More than a small spotting of blood in your stool.  Large blood clots in your stool.  Swelling of your abdomen.  Nausea or vomiting.  A fever.  Increasing pain in your abdomen that is not relieved with medicine. Summary  After the procedure, it is common to have a small amount of blood in your stool. You may also have mild cramping and bloating of your abdomen.  For the first 24 hours after the procedure, do not drive or use machinery, sign important documents, or drink alcohol.  Get help right away if you have a lot of blood in your stool, nausea or vomiting, a fever, or increased pain in your abdomen. This information is not intended to replace advice given to you by your health care provider. Make sure you discuss any questions you have with your health care provider. Document Revised: 06/20/2019 Document Reviewed: 06/20/2019 Elsevier Patient Education  2020 Elsevier Inc.  

## 2020-08-21 NOTE — Interval H&P Note (Signed)
History and Physical Interval Note:  08/21/2020 7:50 AM  Eddie Irwin  has presented today for surgery, with the diagnosis of Screening.  The various methods of treatment have been discussed with the patient and family. After consideration of risks, benefits and other options for treatment, the patient has consented to  Procedure(s): COLONOSCOPY (N/A) as a surgical intervention.  The patient's history has been reviewed, patient examined, no change in status, stable for surgery.  I have reviewed the patient's chart and labs.  Questions were answered to the patient's satisfaction.     [name]

## 2020-08-21 NOTE — Op Note (Signed)
[facility] Patient Name: Eddie Irwin Procedure Date: 08/21/2020 7:43 AM MRN: [id number] Date of Birth: 1961/09/19 Attending MD: [name] , MD CSN: [id number] Age: 59 Admit Type: Outpatient Procedure:                Colonoscopy Indications:              Screening for colorectal malignant neoplasm Providers:                [name], MD, Edrick Kins, RN, Buel Ream.                            Thomasena Edis RN, RN Referring MD:              Medicines:                Midazolam 2 mg IV, Meperidine 50 mg IV Complications:            No immediate complications. Estimated Blood Loss:     Estimated blood loss: none. Procedure:                Pre-Anesthesia Assessment:                           - Prior to the procedure, a History and Physical                            was performed, and patient medications and                            allergies were reviewed. The patient is competent.                            The risks and benefits of the procedure and the                            sedation options and risks were discussed with the                            patient. All questions were answered and informed                            consent was obtained. Patient identification and                            proposed procedure were verified by the physician,                            the nurse and the technician in the endoscopy                            suite. Mental Status Examination: alert and                            oriented. Airway Examination: normal oropharyngeal  airway and neck mobility. Respiratory Examination:                            clear to auscultation. CV Examination: RRR, no                            murmurs, no S3 or S4. Prophylactic Antibiotics: The                            patient does not require prophylactic antibiotics.                            Prior Anticoagulants: The patient has taken no                             previous anticoagulant or antiplatelet agents                            except for aspirin. ASA Grade Assessment: II - A                            patient with mild systemic disease. After reviewing                            the risks and benefits, the patient was deemed in                            satisfactory condition to undergo the procedure.                            The anesthesia plan was to use moderate sedation /                            analgesia (conscious sedation). Immediately prior                            to administration of medications, the patient was                            re-assessed for adequacy to receive sedatives. The                            heart rate, respiratory rate, oxygen saturations,                            blood pressure, adequacy of pulmonary ventilation,                            and response to care were monitored throughout the                            procedure. The physical status of the patient was  re-assessed after the procedure.                           After obtaining informed consent, the colonoscope                            was passed under direct vision. Throughout the                            procedure, the patient's blood pressure, pulse, and                            oxygen saturations were monitored continuously. The                            CF-[id number] ([id number]) scope was introduced through                            the anus and advanced to the the cecum, identified                            by the appendiceal orifice, ileocecal valve and                            palpation. No anatomical landmarks were                            photographed. The entire colon was visualized. The                            entire colon was examined. The colonoscopy was                            performed without difficulty. The patient tolerated                            the procedure well. The  total duration of the                            procedure was 15 minutes. The quality of the bowel                            preparation was adequate. Scope In: 7:55:34 AM Scope Out: 8:06:42 AM Scope Withdrawal Time: 0 hours 6 minutes 37 seconds  Total Procedure Duration: 0 hours 11 minutes 8 seconds  Findings:      Hemorrhoids were found on perianal exam.      The entire examined colon appeared normal on direct and retroflexion       views. Impression:               - Hemorrhoids found on perianal exam.                           - The entire examined colon is normal on direct and  retroflexion views.                           - No specimens collected. Moderate Sedation:      Moderate (conscious) sedation was administered by the endoscopy nurse       and supervised by the endoscopist. The following parameters were       monitored: oxygen saturation, heart rate, blood pressure, and response       to care. Recommendation:           - Written discharge instructions were provided to                            the patient.                           - The signs and symptoms of potential delayed                            complications were discussed with the patient.                           - Patient has a contact number available for                            emergencies.                           - Return to normal activities tomorrow.                           - Resume previous diet.                           - Continue present medications.                           - Repeat colonoscopy in 10 years for screening                            purposes. Procedure Code(s):        --- Professional ---                           [id number], Colonoscopy, flexible; diagnostic, including                            collection of specimen(s) by brushing or washing,                            when performed (separate procedure) Diagnosis Code(s):        --- Professional  ---                           Z12.11, Encounter for screening for malignant                            neoplasm of colon  K64.9, Unspecified hemorrhoids CPT copyright 2019 [employer]. All rights reserved. The codes documented in this report are preliminary and upon coder review may  be revised to meet current compliance requirements. [name], MD [name], MD 08/21/2020 8:12:01 AM This report has been signed electronically. Number of Addenda: 0

## 2020-08-21 NOTE — Progress Notes (Signed)
Pt given an additional 100 mL of IV fluids. BP WNL.

## 2020-08-24 ENCOUNTER — Encounter (HOSPITAL_COMMUNITY): Payer: Self-pay | Admitting: General Surgery

## 2020-12-18 ENCOUNTER — Emergency Department (HOSPITAL_COMMUNITY): Payer: Medicare Other

## 2020-12-18 ENCOUNTER — Other Ambulatory Visit: Payer: Self-pay

## 2020-12-18 ENCOUNTER — Inpatient Hospital Stay (HOSPITAL_COMMUNITY)
Admission: EM | Admit: 2020-12-18 | Discharge: 2020-12-21 | DRG: 683 | Disposition: A | Discharge status: Home / Self Care | Payer: Medicare Other | Attending: Internal Medicine | Admitting: Internal Medicine

## 2020-12-18 ENCOUNTER — Encounter (HOSPITAL_COMMUNITY): Payer: Self-pay | Admitting: Emergency Medicine

## 2020-12-18 DIAGNOSIS — N138 Other obstructive and reflux uropathy: Secondary | ICD-10-CM | POA: Diagnosis present

## 2020-12-18 DIAGNOSIS — N179 Acute kidney failure, unspecified: Principal | ICD-10-CM | POA: Diagnosis present

## 2020-12-18 DIAGNOSIS — M48061 Spinal stenosis, lumbar region without neurogenic claudication: Secondary | ICD-10-CM | POA: Diagnosis present

## 2020-12-18 DIAGNOSIS — R338 Other retention of urine: Secondary | ICD-10-CM | POA: Diagnosis present

## 2020-12-18 DIAGNOSIS — Z79899 Other long term (current) drug therapy: Secondary | ICD-10-CM

## 2020-12-18 DIAGNOSIS — N320 Bladder-neck obstruction: Secondary | ICD-10-CM

## 2020-12-18 DIAGNOSIS — G894 Chronic pain syndrome: Secondary | ICD-10-CM | POA: Diagnosis present

## 2020-12-18 DIAGNOSIS — Z6841 Body Mass Index (BMI) 40.0 and over, adult: Secondary | ICD-10-CM

## 2020-12-18 DIAGNOSIS — E1165 Type 2 diabetes mellitus with hyperglycemia: Secondary | ICD-10-CM | POA: Diagnosis present

## 2020-12-18 DIAGNOSIS — E785 Hyperlipidemia, unspecified: Secondary | ICD-10-CM | POA: Diagnosis present

## 2020-12-18 DIAGNOSIS — I10 Essential (primary) hypertension: Secondary | ICD-10-CM | POA: Diagnosis present

## 2020-12-18 DIAGNOSIS — N139 Obstructive and reflux uropathy, unspecified: Secondary | ICD-10-CM | POA: Diagnosis present

## 2020-12-18 DIAGNOSIS — M545 Low back pain, unspecified: Secondary | ICD-10-CM | POA: Diagnosis present

## 2020-12-18 DIAGNOSIS — Z794 Long term (current) use of insulin: Secondary | ICD-10-CM

## 2020-12-18 DIAGNOSIS — N401 Enlarged prostate with lower urinary tract symptoms: Secondary | ICD-10-CM | POA: Diagnosis present

## 2020-12-18 DIAGNOSIS — R339 Retention of urine, unspecified: Secondary | ICD-10-CM

## 2020-12-18 DIAGNOSIS — Z8616 Personal history of COVID-19: Secondary | ICD-10-CM

## 2020-12-18 DIAGNOSIS — Z7982 Long term (current) use of aspirin: Secondary | ICD-10-CM

## 2020-12-18 DIAGNOSIS — Z87891 Personal history of nicotine dependence: Secondary | ICD-10-CM

## 2020-12-18 DIAGNOSIS — Y92413 State road as the place of occurrence of the external cause: Secondary | ICD-10-CM

## 2020-12-18 DIAGNOSIS — M5416 Radiculopathy, lumbar region: Secondary | ICD-10-CM

## 2020-12-18 DIAGNOSIS — E119 Type 2 diabetes mellitus without complications: Secondary | ICD-10-CM

## 2020-12-18 DIAGNOSIS — Z7984 Long term (current) use of oral hypoglycemic drugs: Secondary | ICD-10-CM

## 2020-12-18 DIAGNOSIS — E872 Acidosis: Secondary | ICD-10-CM | POA: Diagnosis present

## 2020-12-18 DIAGNOSIS — E11649 Type 2 diabetes mellitus with hypoglycemia without coma: Secondary | ICD-10-CM | POA: Diagnosis not present

## 2020-12-18 LAB — URINALYSIS, ROUTINE W REFLEX MICROSCOPIC
Bacteria, UA: NONE SEEN
Bilirubin Urine: NEGATIVE
Glucose, UA: >=500 mg/dL — AB
Hgb urine dipstick: NEGATIVE
Ketones, ur: 5 mg/dL — AB
Leukocytes,Ua: NEGATIVE
Nitrite: NEGATIVE
Protein, ur: NEGATIVE mg/dL
Specific Gravity, Urine: 1.015 (ref 1.005–1.030)
pH: 5 (ref 5.0–8.0)

## 2020-12-18 LAB — DIFFERENTIAL
Abs Immature Granulocytes: 0.06 10*3/uL (ref 0.00–0.07)
Basophils Absolute: 0 10*3/uL (ref 0.0–0.1)
Basophils Relative: 0 %
Eosinophils Absolute: 0.1 10*3/uL (ref 0.0–0.5)
Eosinophils Relative: 2 %
Immature Granulocytes: 1 %
Lymphocytes Relative: 15 %
Lymphs Abs: 1 10*3/uL (ref 0.7–4.0)
Monocytes Absolute: 0.6 10*3/uL (ref 0.1–1.0)
Monocytes Relative: 10 %
Neutro Abs: 4.7 10*3/uL (ref 1.7–7.7)
Neutrophils Relative %: 72 %

## 2020-12-18 LAB — CBC
HCT: 39.6 % (ref 39.0–52.0)
Hemoglobin: 12.1 g/dL — ABNORMAL LOW (ref 13.0–17.0)
MCH: 29.3 pg (ref 26.0–34.0)
MCHC: 30.6 g/dL (ref 30.0–36.0)
MCV: 95.9 fL (ref 80.0–100.0)
Platelets: 227 10*3/uL (ref 150–400)
RBC: 4.13 MIL/uL — ABNORMAL LOW (ref 4.22–5.81)
RDW: 13.2 % (ref 11.5–15.5)
WBC: 6.3 10*3/uL (ref 4.0–10.5)
nRBC: 0 % (ref 0.0–0.2)

## 2020-12-18 LAB — BASIC METABOLIC PANEL
Anion gap: 17 — ABNORMAL HIGH (ref 5–15)
BUN: 80 mg/dL — ABNORMAL HIGH (ref 6–20)
CO2: 16 mmol/L — ABNORMAL LOW (ref 22–32)
Calcium: 8.3 mg/dL — ABNORMAL LOW (ref 8.9–10.3)
Chloride: 96 mmol/L — ABNORMAL LOW (ref 98–111)
Creatinine, Ser: 3.41 mg/dL — ABNORMAL HIGH (ref 0.61–1.24)
GFR, Estimated: 20 mL/min — ABNORMAL LOW (ref >60)
Glucose, Bld: 300 mg/dL — ABNORMAL HIGH (ref 70–99)
Potassium: 5 mmol/L (ref 3.5–5.1)
Sodium: 129 mmol/L — ABNORMAL LOW (ref 135–145)

## 2020-12-18 LAB — RESP PANEL BY RT-PCR (FLU A&B, COVID) ARPGX2
Influenza A by PCR: NEGATIVE
Influenza B by PCR: NEGATIVE
SARS Coronavirus 2 by RT PCR: NEGATIVE

## 2020-12-18 MED ORDER — SODIUM CHLORIDE 0.9 % IV BOLUS
1000.0000 mL | Freq: Once | INTRAVENOUS | Status: AC
  Administered 2020-12-18: 1000 mL via INTRAVENOUS

## 2020-12-18 MED ORDER — OXYCODONE-ACETAMINOPHEN 5-325 MG PO TABS
2.0000 | ORAL_TABLET | Freq: Once | ORAL | Status: AC
  Administered 2020-12-18: 2 via ORAL
  Filled 2020-12-18: qty 2

## 2020-12-18 MED ORDER — KETOROLAC TROMETHAMINE 30 MG/ML IJ SOLN
30.0000 mg | Freq: Once | INTRAMUSCULAR | Status: AC
  Administered 2020-12-18: 30 mg via INTRAMUSCULAR
  Filled 2020-12-18: qty 1

## 2020-12-18 NOTE — ED Notes (Signed)
Chaperoned genitalia exam with R. Sponseller,PA-C

## 2020-12-18 NOTE — ED Notes (Signed)
c- link called at this time for transport Tzippy Testerman 

## 2020-12-18 NOTE — ED Notes (Signed)
Pt unable to urinate, bladder scan completed showing over in bladder. JGinette Pitman aware.

## 2020-12-18 NOTE — ED Notes (Signed)
J. Idol,PA aware of foley placement, 2200 urine output, VS updated, BP 98/56, 86% on room air, pt placed on 3LNC for 02 of 94-95%. Orders to be placed for 1L normal saline bolus.
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
## 2020-12-18 NOTE — ED Notes (Signed)
Patient transported to X-ray 

## 2020-12-18 NOTE — ED Provider Notes (Signed)
Patient signed out to me from [facility] PA-C.  Patient with a history of spinal stenosis and progressively worsening low back pain and urinary retention.  He was in an [facility] Sunday night, please refer to initial HPI with this evening's visit for details.  In creasing urinary retention, states his symptoms started yesterday and has had intermittent numbness in his genitals with radiation in his posterior thighs to his knees bilaterally.  Urinating very small amounts of urine but without relief of urinary retention.  Post void residual revealing for 1000 cc of urine.  Concern for acute cauda equina syndrome.  He will need emergent MR lumbar spine and will need transfer to [facility] for this.   Discussed with Dr. Anitra Lauth with [facility] ED who accepts pt in transfer.     Burgess Amor, PA-C 12/18/20 2231    Terrilee Files, MD 12/19/20 1050

## 2020-12-19 ENCOUNTER — Observation Stay (HOSPITAL_COMMUNITY): Payer: Medicare Other

## 2020-12-19 ENCOUNTER — Emergency Department (HOSPITAL_COMMUNITY): Payer: Medicare Other

## 2020-12-19 DIAGNOSIS — R339 Retention of urine, unspecified: Secondary | ICD-10-CM | POA: Diagnosis present

## 2020-12-19 DIAGNOSIS — Z79899 Other long term (current) drug therapy: Secondary | ICD-10-CM | POA: Diagnosis not present

## 2020-12-19 DIAGNOSIS — N138 Other obstructive and reflux uropathy: Secondary | ICD-10-CM | POA: Diagnosis present

## 2020-12-19 DIAGNOSIS — R338 Other retention of urine: Secondary | ICD-10-CM | POA: Diagnosis not present

## 2020-12-19 DIAGNOSIS — Z7982 Long term (current) use of aspirin: Secondary | ICD-10-CM | POA: Diagnosis not present

## 2020-12-19 DIAGNOSIS — E872 Acidosis: Secondary | ICD-10-CM | POA: Diagnosis present

## 2020-12-19 DIAGNOSIS — Y92413 State road as the place of occurrence of the external cause: Secondary | ICD-10-CM | POA: Diagnosis not present

## 2020-12-19 DIAGNOSIS — M48061 Spinal stenosis, lumbar region without neurogenic claudication: Secondary | ICD-10-CM | POA: Diagnosis present

## 2020-12-19 DIAGNOSIS — Z794 Long term (current) use of insulin: Secondary | ICD-10-CM

## 2020-12-19 DIAGNOSIS — N179 Acute kidney failure, unspecified: Secondary | ICD-10-CM | POA: Diagnosis present

## 2020-12-19 DIAGNOSIS — I10 Essential (primary) hypertension: Secondary | ICD-10-CM | POA: Diagnosis present

## 2020-12-19 DIAGNOSIS — E785 Hyperlipidemia, unspecified: Secondary | ICD-10-CM | POA: Diagnosis present

## 2020-12-19 DIAGNOSIS — N139 Obstructive and reflux uropathy, unspecified: Secondary | ICD-10-CM | POA: Diagnosis present

## 2020-12-19 DIAGNOSIS — G894 Chronic pain syndrome: Secondary | ICD-10-CM | POA: Diagnosis present

## 2020-12-19 DIAGNOSIS — E1165 Type 2 diabetes mellitus with hyperglycemia: Secondary | ICD-10-CM | POA: Diagnosis present

## 2020-12-19 DIAGNOSIS — Z87891 Personal history of nicotine dependence: Secondary | ICD-10-CM | POA: Diagnosis not present

## 2020-12-19 DIAGNOSIS — N320 Bladder-neck obstruction: Secondary | ICD-10-CM

## 2020-12-19 DIAGNOSIS — Z6841 Body Mass Index (BMI) 40.0 and over, adult: Secondary | ICD-10-CM | POA: Diagnosis not present

## 2020-12-19 DIAGNOSIS — Z8616 Personal history of COVID-19: Secondary | ICD-10-CM | POA: Diagnosis not present

## 2020-12-19 DIAGNOSIS — E11649 Type 2 diabetes mellitus with hypoglycemia without coma: Secondary | ICD-10-CM | POA: Diagnosis not present

## 2020-12-19 DIAGNOSIS — Z7984 Long term (current) use of oral hypoglycemic drugs: Secondary | ICD-10-CM | POA: Diagnosis not present

## 2020-12-19 DIAGNOSIS — M545 Low back pain, unspecified: Secondary | ICD-10-CM | POA: Diagnosis present

## 2020-12-19 DIAGNOSIS — N401 Enlarged prostate with lower urinary tract symptoms: Secondary | ICD-10-CM | POA: Diagnosis present

## 2020-12-19 DIAGNOSIS — E119 Type 2 diabetes mellitus without complications: Secondary | ICD-10-CM

## 2020-12-19 LAB — CBG MONITORING, ED
Glucose-Capillary: 227 mg/dL — ABNORMAL HIGH (ref 70–99)
Glucose-Capillary: 184 mg/dL — ABNORMAL HIGH (ref 70–99)

## 2020-12-19 LAB — GLUCOSE, CAPILLARY
Glucose-Capillary: 49 mg/dL — ABNORMAL LOW (ref 70–99)
Glucose-Capillary: 127 mg/dL — ABNORMAL HIGH (ref 70–99)
Glucose-Capillary: 187 mg/dL — ABNORMAL HIGH (ref 70–99)
Glucose-Capillary: 258 mg/dL — ABNORMAL HIGH (ref 70–99)

## 2020-12-19 LAB — HIV ANTIBODY (ROUTINE TESTING W REFLEX): HIV Screen 4th Generation wRfx: NONREACTIVE

## 2020-12-19 LAB — HEMOGLOBIN A1C
Hgb A1c MFr Bld: 8.6 % — ABNORMAL HIGH (ref 4.8–5.6)
Mean Plasma Glucose: 200.12 mg/dL

## 2020-12-19 MED ORDER — OXYCODONE HCL 5 MG PO TABS
15.0000 mg | ORAL_TABLET | ORAL | Status: DC | PRN
  Administered 2020-12-19 – 2020-12-20 (×3): 15 mg via ORAL
  Filled 2020-12-19 (×3): qty 3

## 2020-12-19 MED ORDER — INSULIN ASPART 100 UNIT/ML ~~LOC~~ SOLN
0.0000 [IU] | Freq: Every day | SUBCUTANEOUS | Status: DC
  Administered 2020-12-19: 3 [IU] via SUBCUTANEOUS

## 2020-12-19 MED ORDER — TAMSULOSIN HCL 0.4 MG PO CAPS: 0.4000 mg | ORAL_CAPSULE | Freq: Every day | ORAL | Status: DC

## 2020-12-19 MED ORDER — INSULIN ASPART 100 UNIT/ML ~~LOC~~ SOLN: 10.0000 [IU] | Freq: Three times a day (TID) | SUBCUTANEOUS | Status: DC

## 2020-12-19 MED ORDER — INSULIN GLARGINE 100 UNIT/ML SOLOSTAR PEN: 75.0000 [IU] | PEN_INJECTOR | Freq: Every day | SUBCUTANEOUS | Status: DC

## 2020-12-19 MED ORDER — ALBUTEROL SULFATE HFA 108 (90 BASE) MCG/ACT IN AERS: 1.0000 | INHALATION_SPRAY | Freq: Four times a day (QID) | RESPIRATORY_TRACT | Status: DC | PRN

## 2020-12-19 MED ORDER — GABAPENTIN 300 MG PO CAPS
600.0000 mg | ORAL_CAPSULE | Freq: Two times a day (BID) | ORAL | Status: DC
  Administered 2020-12-19 – 2020-12-21 (×4): 600 mg via ORAL
  Filled 2020-12-19 (×4): qty 2

## 2020-12-19 MED ORDER — ATORVASTATIN CALCIUM 10 MG PO TABS
10.0000 mg | ORAL_TABLET | Freq: Every day | ORAL | Status: DC
  Administered 2020-12-19 – 2020-12-20 (×2): 10 mg via ORAL
  Filled 2020-12-19 (×2): qty 1

## 2020-12-19 MED ORDER — INSULIN GLARGINE 100 UNIT/ML ~~LOC~~ SOLN
65.0000 [IU] | Freq: Every day | SUBCUTANEOUS | Status: DC
  Filled 2020-12-19: qty 0.65

## 2020-12-19 MED ORDER — ONDANSETRON HCL 4 MG/2ML IJ SOLN: 4.0000 mg | Freq: Four times a day (QID) | INTRAMUSCULAR | Status: DC | PRN

## 2020-12-19 MED ORDER — ACETAMINOPHEN 500 MG PO TABS
1000.0000 mg | ORAL_TABLET | Freq: Four times a day (QID) | ORAL | Status: DC | PRN
  Administered 2020-12-19 (×2): 1000 mg via ORAL
  Filled 2020-12-19 (×2): qty 2

## 2020-12-19 MED ORDER — INSULIN ASPART 100 UNIT/ML ~~LOC~~ SOLN: 15.0000 [IU] | Freq: Three times a day (TID) | SUBCUTANEOUS | Status: DC

## 2020-12-19 MED ORDER — CHLORHEXIDINE GLUCONATE CLOTH 2 % EX PADS
6.0000 | MEDICATED_PAD | Freq: Every day | CUTANEOUS | Status: DC
  Administered 2020-12-19 – 2020-12-20 (×2): 6 via TOPICAL

## 2020-12-19 MED ORDER — INSULIN ASPART 100 UNIT/ML ~~LOC~~ SOLN
20.0000 [IU] | Freq: Three times a day (TID) | SUBCUTANEOUS | Status: DC
Start: 2020-12-19 — End: 2020-12-19

## 2020-12-19 MED ORDER — INSULIN GLARGINE 100 UNIT/ML SOLOSTAR PEN: 65.0000 [IU] | PEN_INJECTOR | Freq: Every day | SUBCUTANEOUS | Status: DC

## 2020-12-19 MED ORDER — ONDANSETRON HCL 4 MG PO TABS: 4.0000 mg | ORAL_TABLET | Freq: Four times a day (QID) | ORAL | Status: DC | PRN

## 2020-12-19 MED ORDER — INSULIN ASPART 100 UNIT/ML ~~LOC~~ SOLN
0.0000 [IU] | Freq: Three times a day (TID) | SUBCUTANEOUS | Status: DC
  Administered 2020-12-19: 3 [IU] via SUBCUTANEOUS
  Administered 2020-12-20: 5 [IU] via SUBCUTANEOUS
  Administered 2020-12-20 (×2): 3 [IU] via SUBCUTANEOUS
  Administered 2020-12-21: 8 [IU] via SUBCUTANEOUS
  Administered 2020-12-21: 3 [IU] via SUBCUTANEOUS

## 2020-12-19 MED ORDER — TOPIRAMATE 25 MG PO TABS
25.0000 mg | ORAL_TABLET | Freq: Two times a day (BID) | ORAL | Status: DC
  Administered 2020-12-19 – 2020-12-21 (×5): 25 mg via ORAL
  Filled 2020-12-19 (×5): qty 1

## 2020-12-19 MED ORDER — GABAPENTIN 300 MG PO CAPS
1200.0000 mg | ORAL_CAPSULE | Freq: Two times a day (BID) | ORAL | Status: DC
  Administered 2020-12-19: 1200 mg via ORAL
  Filled 2020-12-19: qty 4

## 2020-12-19 MED ORDER — HEPARIN SODIUM (PORCINE) 5000 UNIT/ML IJ SOLN
5000.0000 [IU] | Freq: Three times a day (TID) | INTRAMUSCULAR | Status: DC
  Administered 2020-12-19 – 2020-12-21 (×7): 5000 [IU] via SUBCUTANEOUS
  Filled 2020-12-19 (×7): qty 1

## 2020-12-19 MED ORDER — INSULIN ASPART 100 UNIT/ML ~~LOC~~ SOLN
0.0000 [IU] | Freq: Three times a day (TID) | SUBCUTANEOUS | Status: DC
  Administered 2020-12-19: 4 [IU] via SUBCUTANEOUS

## 2020-12-19 MED ORDER — LORATADINE 10 MG PO TABS
10.0000 mg | ORAL_TABLET | Freq: Every day | ORAL | Status: DC
  Administered 2020-12-19 – 2020-12-20 (×2): 10 mg via ORAL
  Filled 2020-12-19 (×2): qty 1

## 2020-12-19 MED ORDER — INSULIN ASPART 100 UNIT/ML ~~LOC~~ SOLN
35.0000 [IU] | Freq: Three times a day (TID) | SUBCUTANEOUS | Status: DC
  Administered 2020-12-19: 35 [IU] via SUBCUTANEOUS

## 2020-12-19 MED ORDER — SODIUM CHLORIDE 0.9 % IV SOLN: INTRAVENOUS | Status: DC

## 2020-12-19 MED ORDER — INSULIN ASPART 100 UNIT/ML ~~LOC~~ SOLN: 25.0000 [IU] | Freq: Three times a day (TID) | SUBCUTANEOUS | Status: DC
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
## 2020-12-19 NOTE — ED Notes (Signed)
EDP ok'd pt to ambulate to the restroom PRN.

## 2020-12-19 NOTE — ED Notes (Signed)
Report received form [facility] for Eddie Irwin.
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
## 2020-12-19 NOTE — Progress Notes (Signed)
Hypoglycemic Event  CBG: 49 (before meal) 1215  Treatment: Pt was given orange juice and food, recheck hour later  Symptoms: Asymptomatic  Follow-up CBG: Time:1300 CBG Result: 127  Possible Reasons for Event: Too much insulin coverage ordered  Comments/MD notified Dr. Rhona Leavens was notified. He is holding lunchtime insulin.    Aleda Grana

## 2020-12-19 NOTE — Plan of Care (Signed)

## 2020-12-19 NOTE — Progress Notes (Signed)
Due to his AKI, ok to reduce home dose of gabapentin to max recommended daily dose of 1200mg /day per Dr .  Rhona Leavens, PharmD, BCIDP, AAHIVP, CPP Infectious Disease Pharmacist 12/19/2020 1:17 PM

## 2020-12-19 NOTE — ED Notes (Signed)
[place] - [place] MRI Breakfast ordered

## 2020-12-19 NOTE — ED Notes (Signed)
Attempted report x1. 

## 2020-12-19 NOTE — ED Notes (Signed)
Pt attempted to move bowels on Bedside comomde without success.

## 2020-12-19 NOTE — ED Notes (Signed)
Pt transported to MRI 
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
## 2020-12-20 DIAGNOSIS — N179 Acute kidney failure, unspecified: Principal | ICD-10-CM

## 2020-12-20 LAB — COMPREHENSIVE METABOLIC PANEL
ALT: 33 U/L (ref 0–44)
AST: 19 U/L (ref 15–41)
Albumin: 3.1 g/dL — ABNORMAL LOW (ref 3.5–5.0)
Alkaline Phosphatase: 54 U/L (ref 38–126)
Anion gap: 14 (ref 5–15)
BUN: 43 mg/dL — ABNORMAL HIGH (ref 6–20)
CO2: 20 mmol/L — ABNORMAL LOW (ref 22–32)
Calcium: 8.7 mg/dL — ABNORMAL LOW (ref 8.9–10.3)
Chloride: 107 mmol/L (ref 98–111)
Creatinine, Ser: 1.44 mg/dL — ABNORMAL HIGH (ref 0.61–1.24)
GFR, Estimated: 56 mL/min — ABNORMAL LOW (ref >60)
Glucose, Bld: 182 mg/dL — ABNORMAL HIGH (ref 70–99)
Potassium: 4.7 mmol/L (ref 3.5–5.1)
Sodium: 141 mmol/L (ref 135–145)
Total Bilirubin: 0.9 mg/dL (ref 0.3–1.2)
Total Protein: 6.1 g/dL — ABNORMAL LOW (ref 6.5–8.1)

## 2020-12-20 LAB — GLUCOSE, CAPILLARY
Glucose-Capillary: 156 mg/dL — ABNORMAL HIGH (ref 70–99)
Glucose-Capillary: 172 mg/dL — ABNORMAL HIGH (ref 70–99)
Glucose-Capillary: 189 mg/dL — ABNORMAL HIGH (ref 70–99)
Glucose-Capillary: 227 mg/dL — ABNORMAL HIGH (ref 70–99)
Glucose-Capillary: 169 mg/dL — ABNORMAL HIGH (ref 70–99)

## 2020-12-20 LAB — CBC
HCT: 36.8 % — ABNORMAL LOW (ref 39.0–52.0)
Hemoglobin: 11.7 g/dL — ABNORMAL LOW (ref 13.0–17.0)
MCH: 29.3 pg (ref 26.0–34.0)
MCHC: 31.8 g/dL (ref 30.0–36.0)
MCV: 92 fL (ref 80.0–100.0)
Platelets: 222 10*3/uL (ref 150–400)
RBC: 4 MIL/uL — ABNORMAL LOW (ref 4.22–5.81)
RDW: 13.2 % (ref 11.5–15.5)
WBC: 4.5 10*3/uL (ref 4.0–10.5)
nRBC: 0 % (ref 0.0–0.2)

## 2020-12-20 MED ORDER — TAMSULOSIN HCL 0.4 MG PO CAPS
0.4000 mg | ORAL_CAPSULE | Freq: Every day | ORAL | Status: DC
  Administered 2020-12-20: 0.4 mg via ORAL
  Filled 2020-12-20: qty 1

## 2020-12-20 MED ORDER — OXYCODONE HCL 5 MG PO TABS
15.0000 mg | ORAL_TABLET | ORAL | Status: DC | PRN
  Administered 2020-12-20 – 2020-12-21 (×3): 15 mg via ORAL
  Filled 2020-12-20 (×4): qty 3
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
## 2020-12-20 NOTE — Plan of Care (Signed)
  Problem: Education: Goal: Knowledge of General Education information will improve Description: Including pain rating scale, medication(s)/side effects and non-pharmacologic comfort measures 12/20/2020 0125 by Dorena Cookey, LPN Outcome: Progressing 12/20/2020 0120 by Dorena Cookey, LPN Outcome: Progressing   Problem: Health Behavior/Discharge Planning: Goal: Ability to manage health-related needs will improve 12/20/2020 0125 by Dorena Cookey, LPN Outcome: Progressing 12/20/2020 0120 by Dorena Cookey, LPN Outcome: Progressing   Problem: Clinical Measurements: Goal: Ability to maintain clinical measurements within normal limits will improve 12/20/2020 0125 by Dorena Cookey, LPN Outcome: Progressing 12/20/2020 0120 by Dorena Cookey, LPN Outcome: Progressing Goal: Will remain free from infection 12/20/2020 0125 by Dorena Cookey, LPN Outcome: Progressing 12/20/2020 0120 by Dorena Cookey, LPN Outcome: Progressing Goal: Diagnostic test results will improve 12/20/2020 0125 by Dorena Cookey, LPN Outcome: Progressing 12/20/2020 0120 by Dorena Cookey, LPN Outcome: Progressing Goal: Respiratory complications will improve Outcome: Completed/Met Goal: Cardiovascular complication will be avoided Outcome: Completed/Met   Problem: Clinical Measurements: Goal: Ability to maintain clinical measurements within normal limits will improve 12/20/2020 0125 by Dorena Cookey, LPN Outcome: Progressing 12/20/2020 0120 by Dorena Cookey, LPN Outcome: Progressing Goal: Will remain free from infection 12/20/2020 0125 by Dorena Cookey, LPN Outcome: Progressing 12/20/2020 0120 by Dorena Cookey, LPN Outcome: Progressing Goal: Diagnostic test results will improve 12/20/2020 0125 by Dorena Cookey, LPN Outcome: Progressing 12/20/2020 0120 by Dorena Cookey, LPN Outcome: Progressing Goal: Respiratory complications will improve Outcome:  Completed/Met Goal: Cardiovascular complication will be avoided Outcome: Completed/Met   Problem: Activity: Goal: Risk for activity intolerance will decrease 12/20/2020 0125 by Dorena Cookey, LPN Outcome: Progressing 12/20/2020 0120 by Dorena Cookey, LPN Outcome: Progressing   Problem: Nutrition: Goal: Adequate nutrition will be maintained 12/20/2020 0125 by Dorena Cookey, LPN Outcome: Progressing 12/20/2020 0120 by Dorena Cookey, LPN Outcome: Progressing   Problem: Coping: Goal: Level of anxiety will decrease 12/20/2020 0125 by Dorena Cookey, LPN Outcome: Progressing 12/20/2020 0120 by Dorena Cookey, LPN Outcome: Progressing   Problem: Elimination: Goal: Will not experience complications related to bowel motility 12/20/2020 0125 by Dorena Cookey, LPN Outcome: Progressing 12/20/2020 0120 by Dorena Cookey, LPN Outcome: Progressing Goal: Will not experience complications related to urinary retention 12/20/2020 0125 by Dorena Cookey, LPN Outcome: Progressing 12/20/2020 0120 by Dorena Cookey, LPN Outcome: Progressing   Problem: Pain Managment: Goal: General experience of comfort will improve 12/20/2020 0125 by Dorena Cookey, LPN Outcome: Progressing 12/20/2020 0120 by Dorena Cookey, LPN Outcome: Progressing   Problem: Safety: Goal: Ability to remain free from injury will improve 12/20/2020 0125 by Dorena Cookey, LPN Outcome: Progressing 12/20/2020 0120 by Dorena Cookey, LPN Outcome: Progressing   Problem: Skin Integrity: Goal: Risk for impaired skin integrity will decrease 12/20/2020 0125 by Dorena Cookey, LPN Outcome: Progressing 12/20/2020 0120 by Dorena Cookey, LPN Outcome: Progressing

## 2020-12-20 NOTE — Plan of Care (Signed)
  Problem: Education: Goal: Knowledge of General Education information will improve Description: Including pain rating scale, medication(s)/side effects and non-pharmacologic comfort measures Outcome: Progressing   Problem: Health Behavior/Discharge Planning: Goal: Ability to manage health-related needs will improve Outcome: Progressing   Problem: Clinical Measurements: Goal: Ability to maintain clinical measurements within normal limits will improve Outcome: Progressing Goal: Will remain free from infection Outcome: Progressing Goal: Diagnostic test results will improve Outcome: Progressing Goal: Respiratory complications will improve Outcome: Completed/Met Goal: Cardiovascular complication will be avoided Outcome: Completed/Met   Problem: Activity: Goal: Risk for activity intolerance will decrease Outcome: Progressing   Problem: Nutrition: Goal: Adequate nutrition will be maintained Outcome: Progressing   Problem: Coping: Goal: Level of anxiety will decrease Outcome: Progressing   Problem: Elimination: Goal: Will not experience complications related to bowel motility Outcome: Progressing Goal: Will not experience complications related to urinary retention Outcome: Progressing   Problem: Pain Managment: Goal: General experience of comfort will improve Outcome: Progressing

## 2020-12-20 NOTE — Plan of Care (Signed)
  Problem: Education: Goal: Knowledge of General Education information will improve Description: Including pain rating scale, medication(s)/side effects and non-pharmacologic comfort measures Outcome: Progressing   Problem: Clinical Measurements: Goal: Will remain free from infection Outcome: Progressing   Problem: Activity: Goal: Risk for activity intolerance will decrease Outcome: Progressing   Problem: Coping: Goal: Level of anxiety will decrease Outcome: Progressing   Problem: Elimination: Goal: Will not experience complications related to bowel motility Outcome: Progressing   Problem: Pain Managment: Goal: General experience of comfort will improve Outcome: Progressing   

## 2020-12-21 DIAGNOSIS — N179 Acute kidney failure, unspecified: Secondary | ICD-10-CM | POA: Diagnosis not present

## 2020-12-21 LAB — CBC
HCT: 39.2 % (ref 39.0–52.0)
Hemoglobin: 12 g/dL — ABNORMAL LOW (ref 13.0–17.0)
MCH: 28.6 pg (ref 26.0–34.0)
MCHC: 30.6 g/dL (ref 30.0–36.0)
MCV: 93.6 fL (ref 80.0–100.0)
Platelets: 229 10*3/uL (ref 150–400)
RBC: 4.19 MIL/uL — ABNORMAL LOW (ref 4.22–5.81)
RDW: 13.3 % (ref 11.5–15.5)
WBC: 4.3 10*3/uL (ref 4.0–10.5)
nRBC: 0 % (ref 0.0–0.2)

## 2020-12-21 LAB — BASIC METABOLIC PANEL
Anion gap: 15 (ref 5–15)
BUN: 23 mg/dL — ABNORMAL HIGH (ref 6–20)
CO2: 20 mmol/L — ABNORMAL LOW (ref 22–32)
Calcium: 8.5 mg/dL — ABNORMAL LOW (ref 8.9–10.3)
Chloride: 103 mmol/L (ref 98–111)
Creatinine, Ser: 1.15 mg/dL (ref 0.61–1.24)
GFR, Estimated: >60 mL/min (ref >60)
Glucose, Bld: 238 mg/dL — ABNORMAL HIGH (ref 70–99)
Potassium: 4.7 mmol/L (ref 3.5–5.1)
Sodium: 138 mmol/L (ref 135–145)

## 2020-12-21 LAB — GLUCOSE, CAPILLARY
Glucose-Capillary: 175 mg/dL — ABNORMAL HIGH (ref 70–99)
Glucose-Capillary: 286 mg/dL — ABNORMAL HIGH (ref 70–99)

## 2020-12-21 NOTE — Progress Notes (Signed)
Inpatient Diabetes Program Recommendations  AACE/ADA: New Consensus Statement on Inpatient Glycemic Control (2015)  Target Ranges:  Prepandial:   less than 140 mg/dL      Peak postprandial:   less than 180 mg/dL (1-2 hours)      Critically ill patients:  140 - 180 mg/dL   Lab Results  Component Value Date   GLUCAP 175 (H) 12/21/2020   HGBA1C 8.6 (H) 12/19/2020    Review of Glycemic Control Results for Eddie Irwin, Eddie Irwin (MRN [id number]) as of 12/21/2020 08:07  Ref. Range 12/20/2020 14:51 12/20/2020 16:12 12/20/2020 20:44 12/21/2020 06:51  Glucose-Capillary Latest Ref Range: 70 - 99 mg/dL 553 (H) 748 (H) 270 (H) 175 (H)   Diabetes history: DM 2 Outpatient Diabetes medications:  Glucotrol 10 mg bid, Humalog 50 units tid with meals, Lantus 75 units q HS, Metformin 850 mg daily Current orders for Inpatient glycemic control:  Novolog moderate tid with meals and HS  Inpatient Diabetes Program Recommendations:    Consider adding Lantus 15 units daily.   Thanks, Beryl Meager, RN, BC-ADM Inpatient Diabetes Coordinator Pager [phone] (8a-5p)

## 2020-12-21 NOTE — Plan of Care (Signed)
  Problem: Education: Goal: Knowledge of General Education information will improve Description: Including pain rating scale, medication(s)/side effects and non-pharmacologic comfort measures Outcome: Adequate for Discharge   Problem: Health Behavior/Discharge Planning: Goal: Ability to manage health-related needs will improve Outcome: Adequate for Discharge   Problem: Clinical Measurements: Goal: Ability to maintain clinical measurements within normal limits will improve Outcome: Adequate for Discharge Goal: Will remain free from infection Outcome: Adequate for Discharge Goal: Diagnostic test results will improve Outcome: Adequate for Discharge   Problem: Activity: Goal: Risk for activity intolerance will decrease Outcome: Adequate for Discharge   Problem: Nutrition: Goal: Adequate nutrition will be maintained Outcome: Adequate for Discharge   Problem: Coping: Goal: Level of anxiety will decrease Outcome: Adequate for Discharge   Problem: Elimination: Goal: Will not experience complications related to bowel motility Outcome: Adequate for Discharge Goal: Will not experience complications related to urinary retention Outcome: Adequate for Discharge   Problem: Pain Managment: Goal: General experience of comfort will improve Outcome: Adequate for Discharge   Problem: Safety: Goal: Ability to remain free from injury will improve Outcome: Adequate for Discharge   Problem: Skin Integrity: Goal: Risk for impaired skin integrity will decrease Outcome: Adequate for Discharge   

## 2020-12-21 NOTE — Plan of Care (Signed)
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
## 2020-12-21 NOTE — Progress Notes (Signed)
Eddie Irwin to be Discharged home per MD order.  Discussed follow up appointment with the patient to his PCP in one week. Informed him nothing changed on his home medications, so to continue to take medications as normal. Pt verbalized understanding.  Allergies as of 12/21/2020   No Known Allergies     Medication List    TAKE these medications   acetaminophen 500 MG tablet Commonly known as: TYLENOL Take 1,000 mg by mouth every 6 (six) hours as needed for moderate pain or headache.   albuterol 108 (90 Base) MCG/ACT inhaler Commonly known as: VENTOLIN HFA Inhale 1-2 puffs into the lungs every 6 (six) hours as needed for wheezing or shortness of breath.   aspirin 81 MG tablet Take 81 mg by mouth daily.   atorvastatin 10 MG tablet Commonly known as: LIPITOR Take 10 mg by mouth at bedtime.   bismuth subsalicylate 262 MG/15ML suspension Commonly known as: PEPTO BISMOL Take 30 mLs by mouth every 6 (six) hours as needed for indigestion.   fenofibrate 145 MG tablet Commonly known as: TRICOR Take 145 mg by mouth at bedtime.   fluticasone 50 MCG/ACT nasal spray Commonly known as: FLONASE Place 1 spray into both nostrils daily as needed for allergies.   gabapentin 600 MG tablet Commonly known as: NEURONTIN Take 1,200 mg by mouth 2 (two) times daily.   glipiZIDE 10 MG tablet Commonly known as: GLUCOTROL Take 10 mg by mouth 2 (two) times daily.   HumaLOG KwikPen 100 UNIT/ML KwikPen Generic drug: insulin lispro Inject 0.5 mLs (50 Units total) into the skin 3 (three) times daily. What changed: when to take this   Lantus SoloStar 100 UNIT/ML Solostar Pen Generic drug: insulin glargine Inject 75 Units into the skin at bedtime.   lisinopril 10 MG tablet Commonly known as: ZESTRIL Take 10 mg by mouth at bedtime.   loratadine 10 MG tablet Commonly known as: CLARITIN Take 10 mg by mouth at bedtime.   metFORMIN 850 MG tablet Commonly known as: GLUCOPHAGE Take 850 mg by  mouth daily.   Osteo Bi-Flex Triple Strength Tabs Take 1 tablet by mouth 2 (two) times daily.   oxyCODONE 15 MG immediate release tablet Commonly known as: ROXICODONE Take 15 mg by mouth every 4 (four) hours as needed for pain.   Synjardy 12.04-999 MG Tabs Generic drug: Empagliflozin-metFORMIN HCl Take 1 tablet by mouth 2 (two) times daily.   tamsulosin 0.4 MG Caps capsule Commonly known as: FLOMAX Take 0.4 mg by mouth daily.   topiramate 25 MG tablet Commonly known as: TOPAMAX Take 25 mg by mouth 2 (two) times daily.   Vitamin D (Ergocalciferol) 1.25 MG (50000 UNIT) Caps capsule Commonly known as: DRISDOL Take 50,000 Units by mouth every Friday.       Vitals:   12/21/20 0808 12/21/20 1158  BP: 137/66 139/68  Pulse: 66 (!) 58  Resp: 18 18  Temp: 97.7 F (36.5 C) 98.9 F (37.2 C)  SpO2: 97% 96%    Skin clean, dry and intact without evidence of skin break down, no evidence of skin tears noted. IV catheter discontinued intact. Site without signs and symptoms of complications. Dressing and pressure applied. Pt denies pain at this time. No complaints noted.  An After Visit Summary was printed and given to the patient. Patient escorted via Child psychotherapist, and Discharged home via private auto.  Eddie Irwin [facility] 12/21/2020 1:29 PM
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
## 2022-03-29 IMAGING — US US RENAL
1 series · 14 of 25 positions shown · non-contrast
Comparison: None available at time of dictation.

CLINICAL DATA: Bladder outlet obstruction.

EXAM:
RENAL / URINARY TRACT ULTRASOUND COMPLETE

[Series 1: us renal · 14 of 48 slices shown]
[im 1/48]
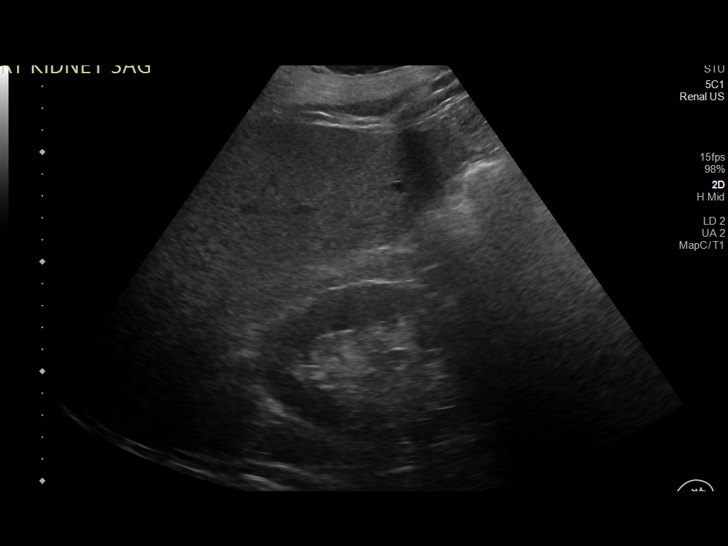
[im 4/48]
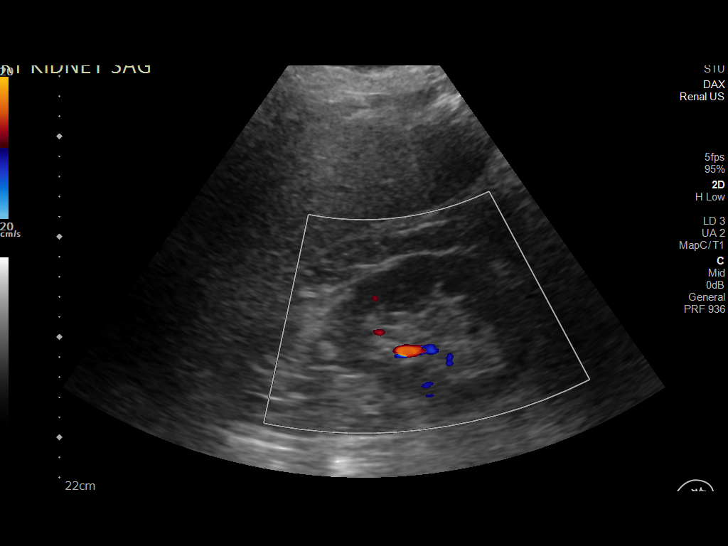
[im 8/48]
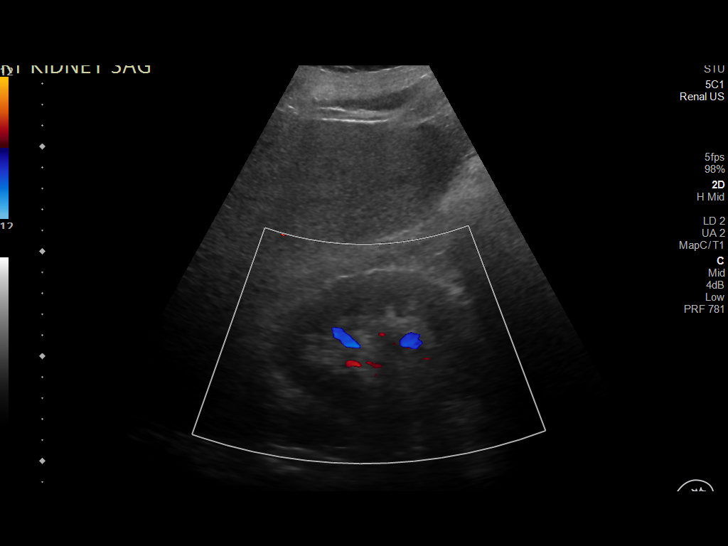
[im 12/48]
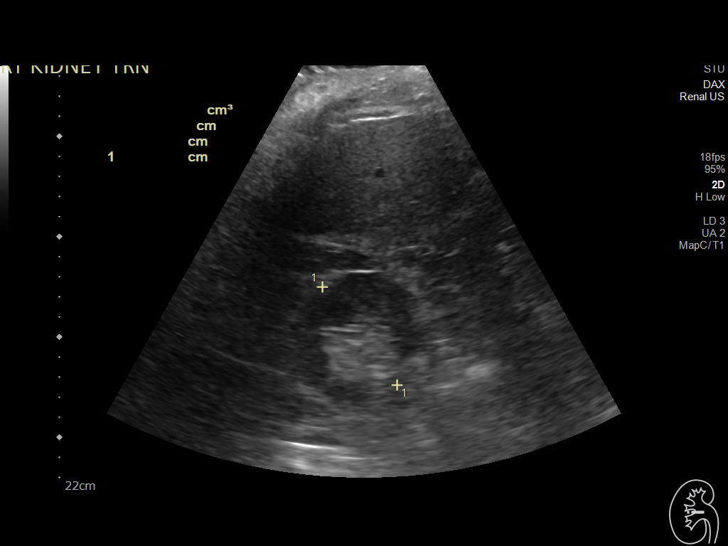
[im 16/48]
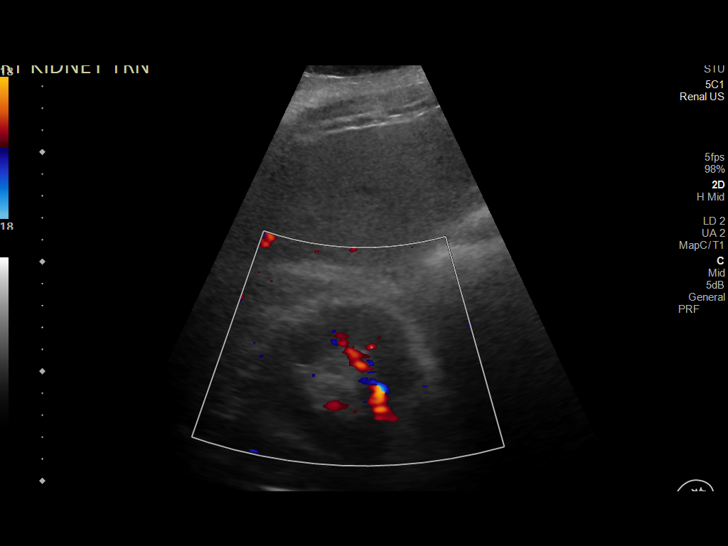
[im 18/48]
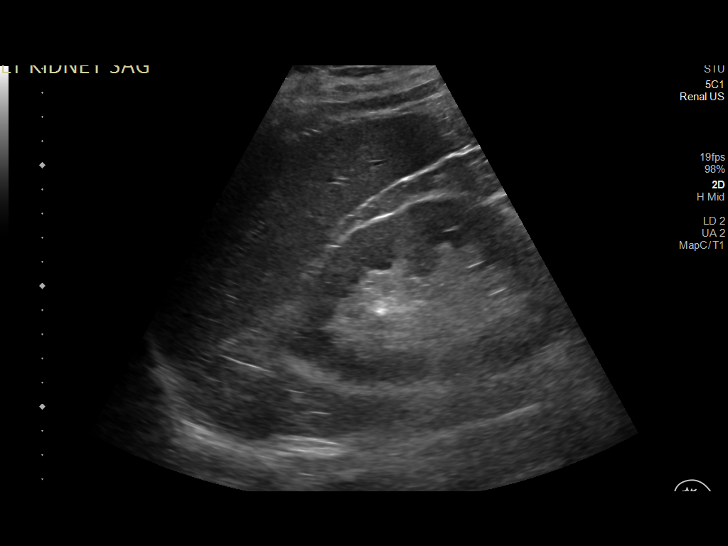
[im 22/48]
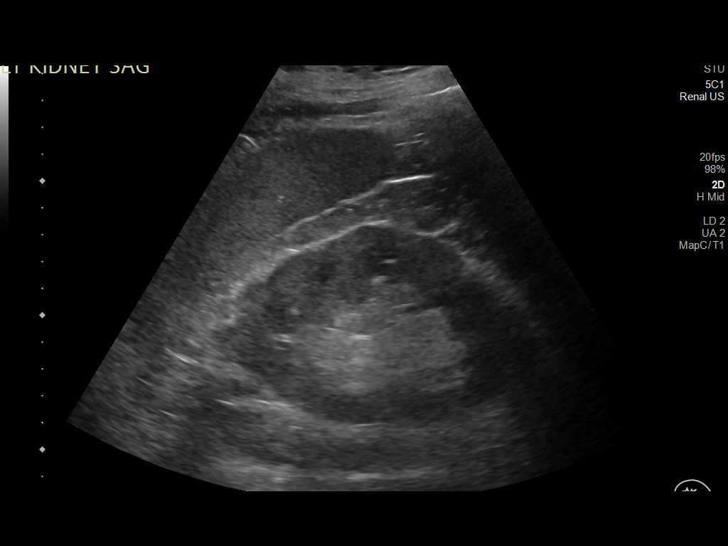
[im 26/48]
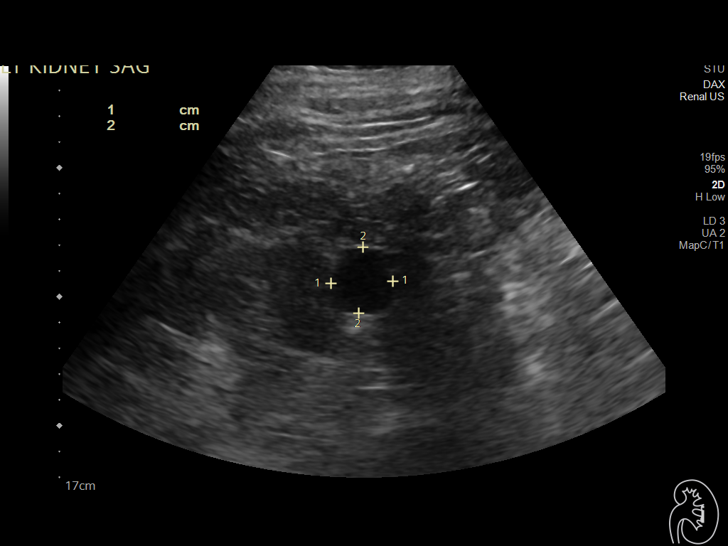
[im 30/48]
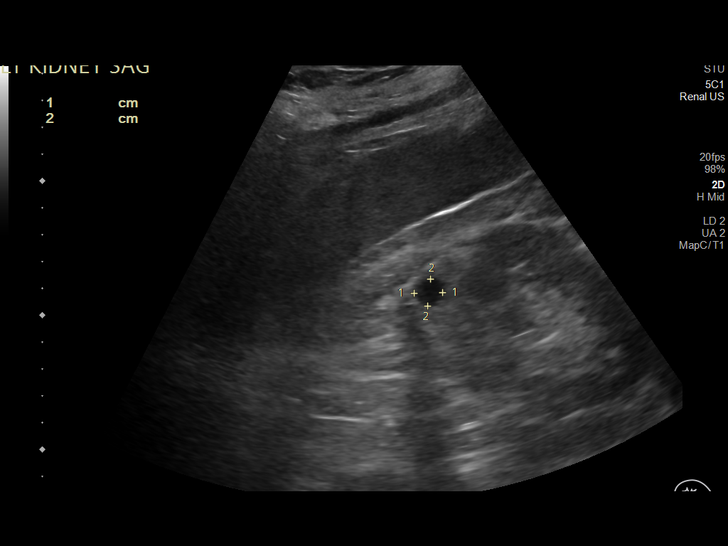
[im 32/48]
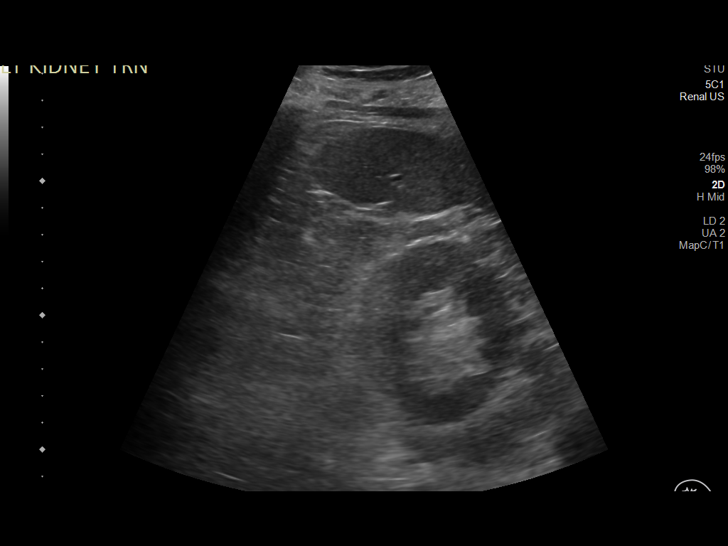
[im 36/48]
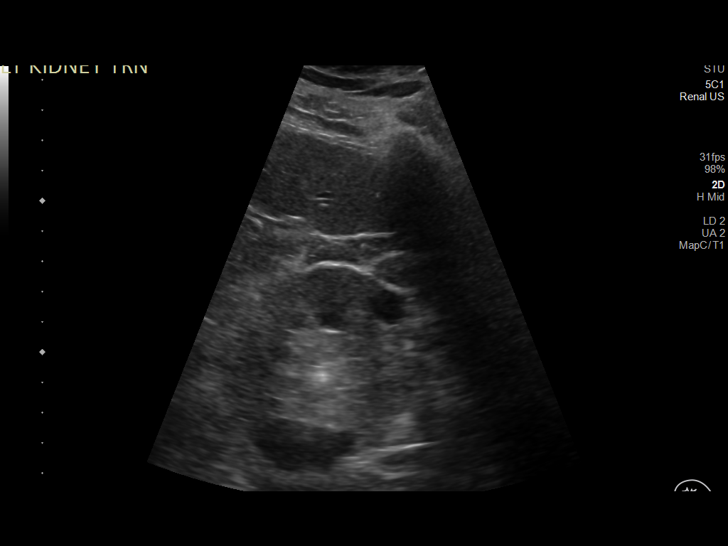
[im 40/48]
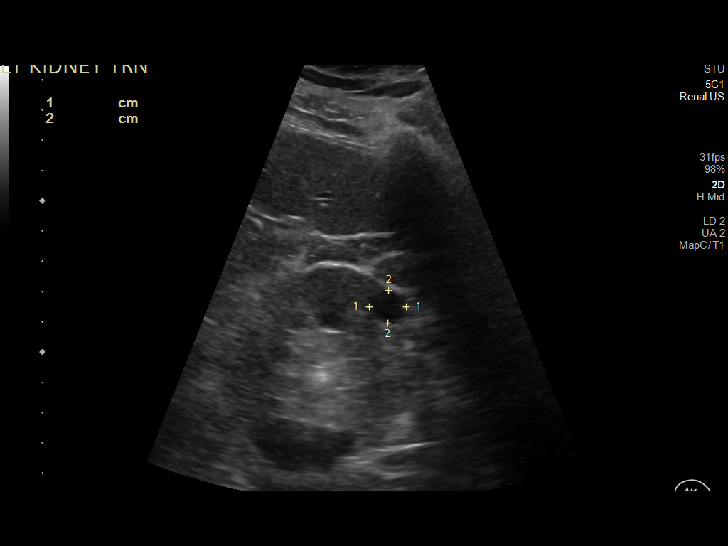
[im 44/48]
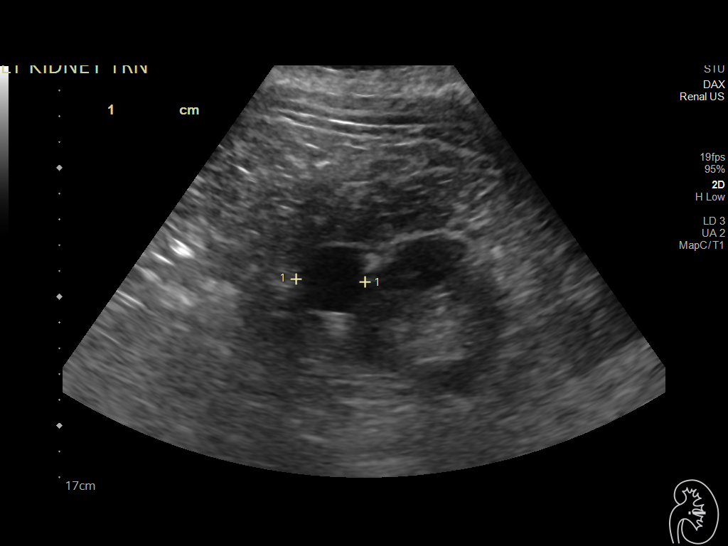
[im 48/48]
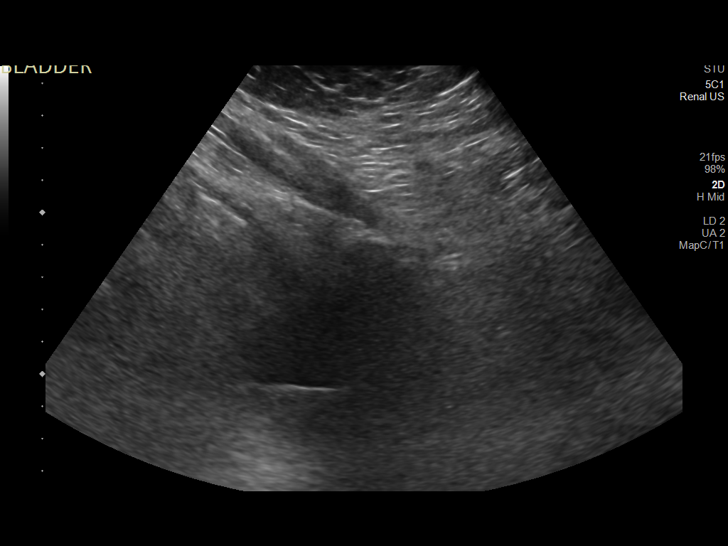

[14 of 25 positions shown; findings below may reference images not displayed]

FINDINGS: Right Kidney:

Renal measurements: 12.4 x 7.6 x 6.1 cm = volume: 304 mL.
Echogenicity within normal limits. No mass or hydronephrosis
visualized.

Left Kidney:

Renal measurements: 12.8 x 7.2 x 6.4 cm = volume: 308 mL.
Echogenicity within normal limits. No hydronephrosis. Interpolar
renal cyst measuring 2.6 x 2.4 x 2.2 cm. Upper pole renal cyst which
measures 1.2 x 1.1 x 1.1 cm.

Bladder:

Bladder is decompressed secondary to recent postvoid.

Other:

None.
IMPRESSION: 1. No hydronephrosis.

## 2022-08-27 LAB — BASIC METABOLIC PANEL
BUN: 24 — AB (ref 4–21)
Creatinine: 1.2 (ref 0.6–1.3)

## 2022-08-27 LAB — COMPREHENSIVE METABOLIC PANEL: eGFR: 69

## 2023-04-10 LAB — HEMOGLOBIN A1C: Hemoglobin A1C: 9.7

## 2023-08-27 LAB — BASIC METABOLIC PANEL
BUN: 26 — AB (ref 4–21)
Creatinine: 1.1 (ref 0.6–1.3)
Glucose: 72

## 2023-08-27 LAB — TSH: TSH: 2.08 (ref 0.41–5.90)

## 2023-08-27 LAB — LIPID PANEL
LDL Cholesterol: 101
Triglycerides: 177 — AB (ref 40–160)

## 2023-08-27 LAB — HEMOGLOBIN A1C: Hemoglobin A1C: 9.3

## 2023-08-27 LAB — COMPREHENSIVE METABOLIC PANEL: eGFR: 77

## 2023-11-11 LAB — BASIC METABOLIC PANEL
BUN: 21 (ref 4–21)
Creatinine: 1.2 (ref 0.6–1.3)
Glucose: 301

## 2023-11-11 LAB — COMPREHENSIVE METABOLIC PANEL: eGFR: 69

## 2023-11-11 LAB — HEMOGLOBIN A1C: Hemoglobin A1C: 10

## 2023-12-10 ENCOUNTER — Encounter: Payer: Medicare Other | Admitting: Nurse Practitioner

## 2023-12-10 DIAGNOSIS — Z794 Long term (current) use of insulin: Secondary | ICD-10-CM

## 2023-12-10 DIAGNOSIS — E782 Mixed hyperlipidemia: Secondary | ICD-10-CM

## 2023-12-10 DIAGNOSIS — Z7985 Long-term (current) use of injectable non-insulin antidiabetic drugs: Secondary | ICD-10-CM

## 2023-12-10 DIAGNOSIS — E559 Vitamin D deficiency, unspecified: Secondary | ICD-10-CM

## 2023-12-10 DIAGNOSIS — E1165 Type 2 diabetes mellitus with hyperglycemia: Secondary | ICD-10-CM

## 2023-12-10 DIAGNOSIS — Z7984 Long term (current) use of oral hypoglycemic drugs: Secondary | ICD-10-CM

## 2023-12-10 DIAGNOSIS — I10 Essential (primary) hypertension: Secondary | ICD-10-CM

## 2023-12-10 NOTE — Progress Notes (Signed)
 Erroneous encounter

## 2024-03-11 NOTE — Patient Instructions (Signed)

## 2024-03-14 ENCOUNTER — Encounter: Payer: Self-pay | Admitting: Nurse Practitioner

## 2024-03-14 ENCOUNTER — Ambulatory Visit: Payer: Medicare Other | Admitting: Nurse Practitioner

## 2024-03-14 VITALS — HR 66 | Ht 71.0 in | Wt 282.6 lb

## 2024-03-14 DIAGNOSIS — E782 Mixed hyperlipidemia: Secondary | ICD-10-CM

## 2024-03-14 DIAGNOSIS — Z794 Long term (current) use of insulin: Secondary | ICD-10-CM | POA: Diagnosis not present

## 2024-03-14 DIAGNOSIS — E1165 Type 2 diabetes mellitus with hyperglycemia: Secondary | ICD-10-CM | POA: Diagnosis not present

## 2024-03-14 DIAGNOSIS — I10 Essential (primary) hypertension: Secondary | ICD-10-CM | POA: Diagnosis not present

## 2024-03-14 MED ORDER — SYNJARDY 12.5-1000 MG PO TABS: 1.0000 | ORAL_TABLET | Freq: Two times a day (BID) | ORAL | 1 refills | Status: AC

## 2024-03-14 NOTE — Progress Notes (Signed)
 Endocrinology Consult Note       03/14/2024, 3:40 PM   Subjective:    Patient ID: Eddie Irwin, male    DOB: 1961/06/10.  Eddie Irwin is being seen in consultation for management of currently uncontrolled symptomatic diabetes requested by  Hattie Perch, FNP.   Past Medical History:  Diagnosis Date   Colon polyp    Diabetes mellitus    Herniated disc    Hyperlipidemia    Joint pain     Past Surgical History:  Procedure Laterality Date   COLONOSCOPY N/A 08/21/2020   Procedure: COLONOSCOPY;  Surgeon: [name], MD;  Location: [facility];  Service: Gastroenterology;  Laterality: N/A;    Social History   Socioeconomic History   Marital status: Married    Spouse name: Not on file   Number of children: Not on file   Years of education: Not on file   Highest education level: Not on file  Occupational History   Not on file  Tobacco Use   Smoking status: Former   Smokeless tobacco: Never  Vaping Use   Vaping status: Never Used  Substance and Sexual Activity   Alcohol use: Yes    Comment: RARE   Drug use: Never   Sexual activity: Not Currently  Other Topics Concern   Not on file  Social History Narrative   Not on file   Social Drivers of Health   Financial Resource Strain: Not on file  Food Insecurity: Not on file  Transportation Needs: Not on file  Physical Activity: Not on file  Stress: Not on file  Social Connections: Unknown (04/21/2022)   Received from [facility], [facility]   Social Network    Social Network: Not on file    Family History  Problem Relation Age of Onset   Heart attack Brother 47       cocaine use   Heart attack Mother 61    Outpatient Encounter Medications as of 03/14/2024  Medication Sig   acetaminophen (TYLENOL) 500 MG tablet Take 1,000 mg by mouth every 6 (six) hours as needed for moderate pain or headache.   albuterol (VENTOLIN HFA) 108  (90 Base) MCG/ACT inhaler Inhale 1-2 puffs into the lungs every 6 (six) hours as needed for wheezing or shortness of breath.   atorvastatin (LIPITOR) 10 MG tablet Take 10 mg by mouth at bedtime.   fenofibrate (TRICOR) 145 MG tablet Take 145 mg by mouth at bedtime.   fluticasone (FLONASE) 50 MCG/ACT nasal spray Place 1 spray into both nostrils daily as needed for allergies.    gabapentin (NEURONTIN) 600 MG tablet Take 1,200 mg by mouth 2 (two) times daily.   HUMALOG KWIKPEN 100 UNIT/ML KwikPen Inject 0.5 mLs (50 Units total) into the skin 3 (three) times daily. (Patient taking differently: Inject 50 Units into the skin 3 (three) times daily before meals.)   LANTUS SOLOSTAR 100 UNIT/ML Solostar Pen Inject 75 Units into the skin at bedtime.   lisinopril (PRINIVIL,ZESTRIL) 10 MG tablet Take 10 mg by mouth at bedtime.   loratadine (CLARITIN) 10 MG tablet Take 10 mg by mouth at bedtime.   Misc Natural Products (OSTEO BI-FLEX TRIPLE  STRENGTH) TABS Take 1 tablet by mouth 2 (two) times daily.   oxyCODONE (ROXICODONE) 15 MG immediate release tablet Take 15 mg by mouth every 4 (four) hours as needed for pain.    Tamsulosin HCl (FLOMAX) 0.4 MG CAPS Take 0.4 mg by mouth daily.   topiramate (TOPAMAX) 25 MG tablet Take 25 mg by mouth 2 (two) times daily.   [DISCONTINUED] glipiZIDE (GLUCOTROL) 10 MG tablet Take 10 mg by mouth 2 (two) times daily.   [DISCONTINUED] SYNJARDY 12.04-999 MG TABS Take 1 tablet by mouth 2 (two) times daily.   aspirin 81 MG tablet Take 81 mg by mouth daily. (Patient not taking: Reported on 03/14/2024)   bismuth subsalicylate (PEPTO BISMOL) 262 MG/15ML suspension Take 30 mLs by mouth every 6 (six) hours as needed for indigestion. (Patient not taking: Reported on 03/14/2024)   SYNJARDY 12.04-999 MG TABS Take 1 tablet by mouth 2 (two) times daily.   Vitamin D, Ergocalciferol, (DRISDOL) 1.25 MG (50000 UNIT) CAPS capsule Take 50,000 Units by mouth every Friday. (Patient not taking: Reported on  03/14/2024)   [DISCONTINUED] metFORMIN (GLUCOPHAGE) 850 MG tablet Take 850 mg by mouth daily. (Patient not taking: Reported on 03/14/2024)   No facility-administered encounter medications on file as of 03/14/2024.    ALLERGIES: No Known Allergies  VACCINATION STATUS:  There is no immunization history on file for this patient.  Diabetes He presents for his initial diabetic visit. He has type 2 diabetes mellitus. Onset time: diagnosed at approx age of 43. His disease course has been stable. Hypoglycemia symptoms include nervousness/anxiousness, sweats and tremors. Associated symptoms include fatigue and foot paresthesias. Hypoglycemia complications include nocturnal hypoglycemia. Symptoms are stable. Diabetic complications include nephropathy, peripheral neuropathy and retinopathy. (Multiple episodes of DKA in the past) Risk factors for coronary artery disease include diabetes mellitus, dyslipidemia, family history, male sex, obesity, hypertension and sedentary lifestyle. Current diabetic treatment includes intensive insulin program and oral agent (triple therapy) (and Ozempic). He is compliant with treatment most of the time (sometimes forgets his meal time insulin). His weight is decreasing steadily. He is following a generally unhealthy and high fat/cholesterol diet. When asked about meal planning, he reported none. He has not had a previous visit with a dietitian. He rarely participates in exercise. (He presents today for his consultation with his CGM showing dramatically fluctuating glycemic profile.  His most recent A1c on 02/11/24 was 9.1%.  Analysis of his CGM shows TIR 38%, TAR 59%, TBR 3% with a GMI of 8.1%  He drinks mostly water, will have a diet soda if he is out and about.  He eats 2 meals per day, and does not snack often (only when he has low glucose readings).  He does have a diet high in saturated fats (eats [employer] most days) or fried foods at home.  He does not engage in routine physical  activity due to back problems.  He is UTD on eye exam, has never seen podiatry in the past.) An ACE inhibitor/angiotensin II receptor blocker is being taken. He does not see a podiatrist.Eye exam is current.     Review of systems  Constitutional: + decreasing body weight, current Body mass index is 39.41 kg/m., no fatigue, no subjective hyperthermia, no subjective hypothermia Eyes: no blurry vision, no xerophthalmia ENT: no sore throat, no nodules palpated in throat, no dysphagia/odynophagia, no hoarseness Cardiovascular: no chest pain, no shortness of breath, no palpitations, no leg swelling Respiratory: no cough, no shortness of breath Gastrointestinal: no nausea/vomiting/diarrhea Musculoskeletal: no  muscle/joint aches Skin: no rashes, no hyperemia Neurological: no tremors, no numbness, no tingling, no dizziness Psychiatric: no depression, no anxiety  Objective:     Pulse 66   Ht 5\' 11"  (1.803 m)   Wt 282 lb 9.6 oz (128.2 kg)   BMI 39.41 kg/m   Wt Readings from Last 3 Encounters:  03/14/24 282 lb 9.6 oz (128.2 kg)  12/18/20 (!) 305 lb (138.3 kg)  08/09/20 299 lb (135.6 kg)     BP Readings from Last 3 Encounters:  12/21/20 139/68  08/21/20 112/67  08/09/20 115/79     Physical Exam- Limited  Constitutional:  Body mass index is 39.41 kg/m. , not in acute distress, normal state of mind Eyes:  EOMI, no exophthalmos Neck: Supple Cardiovascular: RRR, no murmurs, rubs, or gallops, no edema Respiratory: Adequate breathing efforts, no crackles, rales, rhonchi, or wheezing Musculoskeletal: no gross deformities, strength intact in all four extremities, no gross restriction of joint movements Skin:  no rashes, no hyperemia Neurological: no tremor with outstretched hands   Diabetic Foot Exam - Simple   No data filed      CMP ( most recent) CMP     Component Value Date/Time   NA 138 12/21/2020 0259   K 4.7 12/21/2020 0259   CL 103 12/21/2020 0259   CO2 20 (L)  12/21/2020 0259   GLUCOSE 238 (H) 12/21/2020 0259   BUN 21 11/11/2023 0000   CREATININE 1.2 11/11/2023 0000   CREATININE 1.15 12/21/2020 0259   CALCIUM 8.5 (L) 12/21/2020 0259   PROT 6.1 (L) 12/20/2020 0215   ALBUMIN 3.1 (L) 12/20/2020 0215   AST 19 12/20/2020 0215   ALT 33 12/20/2020 0215   ALKPHOS 54 12/20/2020 0215   BILITOT 0.9 12/20/2020 0215   EGFR 69 11/11/2023 0000   GFRNONAA >60 12/21/2020 0259     Diabetic Labs (most recent): Lab Results  Component Value Date   HGBA1C 10 11/11/2023   HGBA1C 9.3 08/27/2023   HGBA1C 9.7 04/10/2023     Lipid Panel ( most recent) Lipid Panel     Component Value Date/Time   CHOL 145 12/14/2019 0154   TRIG 177 (A) 08/27/2023 0000   HDL 30 (L) 12/14/2019 0154   CHOLHDL 4.8 12/14/2019 0154   VLDL 50 (H) 12/14/2019 0154   LDLCALC 101 08/27/2023 0000      Lab Results  Component Value Date   TSH 2.08 08/27/2023           Assessment & Plan:   1) Type 2 diabetes mellitus with hyperglycemia, with long-term current use of insulin (HCC) (Primary)  He presents today for his consultation with his CGM showing dramatically fluctuating glycemic profile.  His most recent A1c on 02/11/24 was 9.1%.  Analysis of his CGM shows TIR 38%, TAR 59%, TBR 3% with a GMI of 8.1%  He drinks mostly water, will have a diet soda if he is out and about.  He eats 2 meals per day, and does not snack often (only when he has low glucose readings).  He does have a diet high in saturated fats (eats [employer] most days) or fried foods at home.  He does not engage in routine physical activity due to back problems.  He is UTD on eye exam, has never seen podiatry in the past.  - Eddie Irwin has currently uncontrolled symptomatic type 2 DM since 63 years of age, with most recent A1c of 9.1 %.   -Recent labs reviewed.  - I  had a long discussion with him about the progressive nature of diabetes and the pathology behind its complications. -his diabetes is  complicated by mild CKD, mild retinopathy, neuropathy and he remains at a high risk for more acute and chronic complications which include CAD, CVA, CKD, retinopathy, and neuropathy. These are all discussed in detail with him.  The following Lifestyle Medicine recommendations according to American College of Lifestyle Medicine [facility]) were discussed and offered to patient and he agrees to start the journey:  A. Whole Foods, Plant-based plate comprising of fruits and vegetables, plant-based proteins, whole-grain carbohydrates was discussed in detail with the patient.   A list for source of those nutrients were also provided to the patient.  Patient will use only water or unsweetened tea for hydration. B.  The need to stay away from risky substances including alcohol, smoking; obtaining 7 to 9 hours of restorative sleep, at least 150 minutes of moderate intensity exercise weekly, the importance of healthy social connections,  and stress reduction techniques were discussed. C.  A full color page of  Calorie density of various food groups per pound showing examples of each food groups was provided to the patient.  - I have counseled him on diet and weight management by adopting a carbohydrate restricted/protein rich diet. Patient is encouraged to switch to unprocessed or minimally processed complex starch and increased protein intake (animal or plant source), fruits, and vegetables. -  he is advised to stick to a routine mealtimes to eat 3 meals a day and avoid unnecessary snacks (to snack only to correct hypoglycemia).   - he acknowledges that there is a room for improvement in his food and drink choices. - Suggestion is made for him to avoid simple carbohydrates from his diet including Cakes, Sweet Desserts, Ice Cream, Soda (diet and regular), Sweet Tea, Candies, Chips, Cookies, Store Bought Juices, Alcohol in Excess of 1-2 drinks a day, Artificial Sweeteners, Coffee Creamer, and "Sugar-free" Products. This  will help patient to have more stable blood glucose profile and potentially avoid unintended weight gain.  - I have approached him with the following individualized plan to manage his diabetes and patient agrees:   -He is advised to adjust his Lantus to 30 units SQ nightly, adjust his Humalog to 6-12 units TID with meals if glucose is above 90 and he is eating (Specific instructions on how to titrate insulin dosage based on glucose readings given to patient in writing).  He demonstrated his ability to properly use the SSI chart with me today.  He is skeptical this dose will work for him as he was on high dose of insulin previously.  -he is encouraged to start/continue monitoring glucose 4 times daily(using his CGM), before meals and before bed.  - he is warned not to take insulin without proper monitoring per orders. - Adjustment parameters are given to him for hypo and hyperglycemia in writing. - he is encouraged to call clinic for blood glucose levels less than 70 or above 300 mg /dl. - he is advised to continue Synjardy 12.04/999 mg po twice daily, therapeutically suitable for patient . - his Glipizide will be discontinued, risk outweighs benefit for this patient, given severe hypoglycemia at times.  - he can continue Ozempic 2 mg SQ weekly.  He does not have family or personal history of MTC or pancreatitis.  He does not smoke.  He notes he has trouble affording his medications.  Will give him patient assistance form for [employer].  Will  switch to Tresiba/Novolog and give pen needles on that script too.  - Specific targets for  A1c; LDL, HDL, and Triglycerides were discussed with the patient.  2) Blood Pressure /Hypertension:  his blood pressure is controlled to target.   he is advised to continue his current medications as prescribed by his PCP.  3) Lipids/Hyperlipidemia:    Review of his recent lipid panel from 08/27/23 showed uncontrolled LDL at 101 and elevated triglycerides of 177.   he is advised to continue Lipitor 20 mg daily at bedtime.  Side effects and precautions discussed with him.  4)  Weight/Diet:  his Body mass index is 39.41 kg/m.  -  clearly complicating his diabetes care.   he is a candidate for weight loss. I discussed with him the fact that loss of 5 - 10% of his  current body weight will have the most impact on his diabetes management.  Exercise, and detailed carbohydrates information provided  -  detailed on discharge instructions.  5) Chronic Care/Health Maintenance: -he is on ACEI/ARB and Statin medications and is encouraged to initiate and continue to follow up with Ophthalmology, Dentist, Podiatrist at least yearly or according to recommendations, and advised to stay away from smoking. I have recommended yearly flu vaccine and pneumonia vaccine at least every 5 years; moderate intensity exercise for up to 150 minutes weekly; and sleep for at least 7 hours a day.  - he is advised to maintain close follow up with Hattie Perch, FNP for primary care needs, as well as his other providers for optimal and coordinated care.   - Time spent in this patient care: 60 min, of which > 50% was spent in counseling him about his diabetes and the rest reviewing his blood glucose logs, discussing his hypoglycemia and hyperglycemia episodes, reviewing his current and previous labs/studies (including abstraction from other facilities) and medications doses and developing a long term treatment plan based on the latest standards of care/guidelines; and documenting his care.    Please refer to Patient Instructions for Blood Glucose Monitoring and Insulin/Medications Dosing Guide" in media tab for additional information. Please also refer to "Patient Self Inventory" in the Media tab for reviewed elements of pertinent patient history.  Cyndi Bender participated in the discussions, expressed understanding, and voiced agreement with the above plans.  All questions were  answered to his satisfaction. he is encouraged to contact clinic should he have any questions or concerns prior to his return visit.     Follow up plan: - Return in about 4 months (around 07/14/2024) for Diabetes F/U with A1c in office, No previsit labs, Bring meter and logs.    Ronny Bacon, [facility] [address] [place], [place] [address] Phone: [phone] Fax: [phone]  03/14/2024, 3:40 PM

## 2024-07-19 ENCOUNTER — Encounter: Payer: Self-pay | Admitting: Nurse Practitioner

## 2024-07-19 ENCOUNTER — Ambulatory Visit: Admitting: Nurse Practitioner

## 2024-07-19 VITALS — BP 112/60 | HR 99 | Ht 71.0 in | Wt 273.2 lb

## 2024-07-19 DIAGNOSIS — Z794 Long term (current) use of insulin: Secondary | ICD-10-CM | POA: Diagnosis not present

## 2024-07-19 DIAGNOSIS — E1165 Type 2 diabetes mellitus with hyperglycemia: Secondary | ICD-10-CM

## 2024-07-19 DIAGNOSIS — E782 Mixed hyperlipidemia: Secondary | ICD-10-CM | POA: Diagnosis not present

## 2024-07-19 DIAGNOSIS — I10 Essential (primary) hypertension: Secondary | ICD-10-CM | POA: Diagnosis not present

## 2024-07-19 NOTE — Progress Notes (Signed)
 Endocrinology Follow Up Note       07/19/2024, 2:19 PM   Subjective:    Patient ID: Eddie Irwin, male    DOB: Dec 23, 1960.  Eddie Irwin is being seen in follow up after being seen in consultation for management of currently uncontrolled symptomatic diabetes requested by  Franchot Philippe CROME, FNP.   Past Medical History:  Diagnosis Date   Colon polyp    Diabetes mellitus    Herniated disc    Hyperlipidemia    Joint pain     Past Surgical History:  Procedure Laterality Date   COLONOSCOPY N/A 08/21/2020   Procedure: COLONOSCOPY;  Surgeon: Mavis Anes, MD;  Location: [facility];  Service: Gastroenterology;  Laterality: N/A;    Social History   Socioeconomic History   Marital status: Married    Spouse name: Not on file   Number of children: Not on file   Years of education: Not on file   Highest education level: Not on file  Occupational History   Not on file  Tobacco Use   Smoking status: Former   Smokeless tobacco: Never  Vaping Use   Vaping status: Never Used  Substance and Sexual Activity   Alcohol use: Yes    Comment: RARE   Drug use: Never   Sexual activity: Not Currently  Other Topics Concern   Not on file  Social History Narrative   Not on file   Social Drivers of Health   Financial Resource Strain: Not on file  Food Insecurity: Not on file  Transportation Needs: Not on file  Physical Activity: Not on file  Stress: Not on file  Social Connections: Unknown (04/21/2022)   Received from [facility]   Social Network    Social Network: Not on file    Family History  Problem Relation Age of Onset   Heart attack Brother 75       cocaine use   Heart attack Mother 22    Outpatient Encounter Medications as of 07/19/2024  Medication Sig   acetaminophen  (TYLENOL ) 500 MG tablet Take 1,000 mg by mouth every 6 (six) hours as needed for moderate pain or headache.   albuterol   (VENTOLIN  HFA) 108 (90 Base) MCG/ACT inhaler Inhale 1-2 puffs into the lungs every 6 (six) hours as needed for wheezing or shortness of breath.   atorvastatin  (LIPITOR) 10 MG tablet Take 10 mg by mouth at bedtime.   fenofibrate  (TRICOR ) 145 MG tablet Take 145 mg by mouth at bedtime.   fluticasone (FLONASE) 50 MCG/ACT nasal spray Place 1 spray into both nostrils daily as needed for allergies.    gabapentin  (NEURONTIN ) 600 MG tablet Take 1,200 mg by mouth 2 (two) times daily.   HUMALOG  KWIKPEN 100 UNIT/ML KwikPen Inject 0.5 mLs (50 Units total) into the skin 3 (three) times daily. (Patient taking differently: Inject 50 Units into the skin 3 (three) times daily before meals. Patient states that he is injecting 20- 50 units at least twice a day)   LANTUS  SOLOSTAR 100 UNIT/ML Solostar Pen Inject 75 Units into the skin at bedtime. (Patient taking differently: Inject 20 Units into the skin at bedtime.)   lisinopril (PRINIVIL,ZESTRIL) 10 MG  tablet Take 10 mg by mouth at bedtime.   loratadine  (CLARITIN ) 10 MG tablet Take 10 mg by mouth at bedtime.   Misc Natural Products (OSTEO BI-FLEX TRIPLE STRENGTH) TABS Take 1 tablet by mouth 2 (two) times daily.   oxyCODONE  (ROXICODONE ) 15 MG immediate release tablet Take 15 mg by mouth every 4 (four) hours as needed for pain.    SYNJARDY  12.04-999 MG TABS Take 1 tablet by mouth 2 (two) times daily.   Tamsulosin  HCl (FLOMAX ) 0.4 MG CAPS Take 0.4 mg by mouth daily.   topiramate  (TOPAMAX ) 25 MG tablet Take 25 mg by mouth 2 (two) times daily.   aspirin  81 MG tablet Take 81 mg by mouth daily. (Patient not taking: Reported on 07/19/2024)   bismuth subsalicylate (PEPTO BISMOL) 262 MG/15ML suspension Take 30 mLs by mouth every 6 (six) hours as needed for indigestion. (Patient not taking: Reported on 07/19/2024)   Vitamin D, Ergocalciferol, (DRISDOL) 1.25 MG (50000 UNIT) CAPS capsule Take 50,000 Units by mouth every Friday. (Patient not taking: Reported on 07/19/2024)   No  facility-administered encounter medications on file as of 07/19/2024.    ALLERGIES: No Known Allergies  VACCINATION STATUS:  There is no immunization history on file for this patient.  Diabetes He presents for his follow-up diabetic visit. He has type 2 diabetes mellitus. Onset time: diagnosed at approx age of 51. His disease course has been fluctuating. Hypoglycemia symptoms include nervousness/anxiousness, sweats and tremors. Associated symptoms include fatigue and foot paresthesias. Hypoglycemia complications include nocturnal hypoglycemia. Symptoms are stable. Diabetic complications include nephropathy, peripheral neuropathy and retinopathy. (Multiple episodes of DKA in the past) Risk factors for coronary artery disease include diabetes mellitus, dyslipidemia, family history, male sex, obesity, hypertension and sedentary lifestyle. Current diabetic treatment includes intensive insulin  program and oral agent (dual therapy) (and Ozempic). He is compliant with treatment most of the time. His weight is decreasing steadily. He is following a generally unhealthy and high fat/cholesterol diet. When asked about meal planning, he reported none. He has not had a previous visit with a dietitian. He rarely participates in exercise. His home blood glucose trend is fluctuating dramatically. His overall blood glucose range is >200 mg/dl. (He presents today with his CGM showing dramatically fluctuating glycemic profile overall. His most recent A1c on 6/11 by his PCP was 10.8%, increasing from last visit of 9.1%.  Analysis of his CGM shows TIR 21%, TAR 79%, TBR 0% with a GMI of 9.7%.  He admits his diet can still use some work, still consuming high fat processed foods.  He also admits to taking more Humalog  to cover spikes in sugar.  He has not had his Ozempic in over 4 months, says his patient assistance was denied from [employer], saying he was privately insured although he has a Health visitor.) An ACE  inhibitor/angiotensin II receptor blocker is being taken. He does not see a podiatrist.Eye exam is current.     Review of systems  Constitutional: + decreasing body weight,  current Body mass index is 38.1 kg/m. , no fatigue, no subjective hyperthermia, no subjective hypothermia Eyes: no blurry vision, no xerophthalmia ENT: no sore throat, no nodules palpated in throat, no dysphagia/odynophagia, no hoarseness Cardiovascular: no chest pain, no shortness of breath, no palpitations, no leg swelling Respiratory: no cough, no shortness of breath Gastrointestinal: no nausea/vomiting/diarrhea Musculoskeletal: no muscle/joint aches Skin: no rashes, no hyperemia Neurological: no tremors, no numbness, no tingling, no dizziness Psychiatric: no depression, no anxiety  Objective:  BP 112/60 (BP Location: Left Arm, Patient Position: Sitting, Cuff Size: Large)   Pulse 99   Ht 5' 11 (1.803 m)   Wt 273 lb 3.2 oz (123.9 kg)   BMI 38.10 kg/m   Wt Readings from Last 3 Encounters:  07/19/24 273 lb 3.2 oz (123.9 kg)  03/14/24 282 lb 9.6 oz (128.2 kg)  12/18/20 (!) 305 lb (138.3 kg)     BP Readings from Last 3 Encounters:  07/19/24 112/60  12/21/20 139/68  08/21/20 112/67      Physical Exam- Limited  Constitutional:  Body mass index is 38.1 kg/m. , not in acute distress, normal state of mind Eyes:  EOMI, no exophthalmos Musculoskeletal: no gross deformities, strength intact in all four extremities, no gross restriction of joint movements Skin:  no rashes, no hyperemia Neurological: no tremor with outstretched hands   Diabetic Foot Exam - Simple   Simple Foot Form Diabetic Foot exam was performed with the following findings: Yes 07/19/2024  2:09 PM  Visual Inspection No deformities, no ulcerations, no other skin breakdown bilaterally: Yes Sensation Testing Intact to touch and monofilament testing bilaterally: Yes Pulse Check Posterior Tibialis and Dorsalis pulse intact  bilaterally: Yes Comments      CMP ( most recent) CMP     Component Value Date/Time   NA 138 12/21/2020 0259   K 4.7 12/21/2020 0259   CL 103 12/21/2020 0259   CO2 20 (L) 12/21/2020 0259   GLUCOSE 238 (H) 12/21/2020 0259   BUN 21 11/11/2023 0000   CREATININE 1.2 11/11/2023 0000   CREATININE 1.15 12/21/2020 0259   CALCIUM  8.5 (L) 12/21/2020 0259   PROT 6.1 (L) 12/20/2020 0215   ALBUMIN 3.1 (L) 12/20/2020 0215   AST 19 12/20/2020 0215   ALT 33 12/20/2020 0215   ALKPHOS 54 12/20/2020 0215   BILITOT 0.9 12/20/2020 0215   EGFR 69 11/11/2023 0000   GFRNONAA >60 12/21/2020 0259     Diabetic Labs (most recent): Lab Results  Component Value Date   HGBA1C 10 11/11/2023   HGBA1C 9.3 08/27/2023   HGBA1C 9.7 04/10/2023     Lipid Panel ( most recent) Lipid Panel     Component Value Date/Time   CHOL 145 12/14/2019 0154   TRIG 177 (A) 08/27/2023 0000   HDL 30 (L) 12/14/2019 0154   CHOLHDL 4.8 12/14/2019 0154   VLDL 50 (H) 12/14/2019 0154   LDLCALC 101 08/27/2023 0000      Lab Results  Component Value Date   TSH 2.08 08/27/2023           Assessment & Plan:   1) Type 2 diabetes mellitus with hyperglycemia, with long-term current use of insulin  (HCC) (Primary)  He presents today with his CGM showing dramatically fluctuating glycemic profile overall. His most recent A1c on 6/11 by his PCP was 10.8%, increasing from last visit of 9.1%.  Analysis of his CGM shows TIR 21%, TAR 79%, TBR 0% with a GMI of 9.7%.  He admits his diet can still use some work, still consuming high fat processed foods.  He also admits to taking more Humalog  to cover spikes in sugar.  He has not had his Ozempic in over 4 months, says his patient assistance was denied from [employer], saying he was privately insured although he has a Health visitor.  - JAHNI NAZAR has currently uncontrolled symptomatic type 2 DM since 63 years of age.   -Recent labs reviewed.  - I had a long  discussion with him about the progressive nature of diabetes and the pathology behind its complications. -his diabetes is complicated by mild CKD, mild retinopathy, neuropathy and he remains at a high risk for more acute and chronic complications which include CAD, CVA, CKD, retinopathy, and neuropathy. These are all discussed in detail with him.  The following Lifestyle Medicine recommendations according to American College of Lifestyle Medicine [facility]) were discussed and offered to patient and he agrees to start the journey:  A. Whole Foods, Plant-based plate comprising of fruits and vegetables, plant-based proteins, whole-grain carbohydrates was discussed in detail with the patient.   A list for source of those nutrients were also provided to the patient.  Patient will use only water  or unsweetened tea for hydration. B.  The need to stay away from risky substances including alcohol, smoking; obtaining 7 to 9 hours of restorative sleep, at least 150 minutes of moderate intensity exercise weekly, the importance of healthy social connections,  and stress reduction techniques were discussed. C.  A full color page of  Calorie density of various food groups per pound showing examples of each food groups was provided to the patient.  - Nutritional counseling repeated at each appointment due to patients tendency to fall back in to old habits.  - The patient admits there is a room for improvement in their diet and drink choices. -  Suggestion is made for the patient to avoid simple carbohydrates from their diet including Cakes, Sweet Desserts / Pastries, Ice Cream, Soda (diet and regular), Sweet Tea, Candies, Chips, Cookies, Sweet Pastries, Store Bought Juices, Alcohol in Excess of 1-2 drinks a day, Artificial Sweeteners, Coffee Creamer, and Sugar-free Products. This will help patient to have stable blood glucose profile and potentially avoid unintended weight gain.   - I encouraged the patient to switch to  unprocessed or minimally processed complex starch and increased protein intake (animal or plant source), fruits, and vegetables.   - Patient is advised to stick to a routine mealtimes to eat 3 meals a day and avoid unnecessary snacks (to snack only to correct hypoglycemia).  - I have approached him with the following individualized plan to manage his diabetes and patient agrees:   -He is advised to adjust his Lantus  to 50 units SQ nightly, adjust his Humalog  to 15-21 units TID with meals if glucose is above 90 and he is eating (Specific instructions on how to titrate insulin  dosage based on glucose readings given to patient in writing).  He typically eats only 2 big meals per day so this may be a better dosage for him.  He can continue Synjardy  12.04/999 mg po twice daily.  Ideally would like to restart Ozempic but need to investigate as to why his PAP application was denied by [employer].  Will have my nurse call them to inquire.  -he is encouraged to continue monitoring glucose 4 times daily(using his CGM), before meals and before bed and to call the clinic if he has readings less than 70 or above 300 for 3 tests in a row.  I asked that he send me readings in 2 weeks so we can adjust his dosages appropriately.  - he is warned not to take insulin  without proper monitoring per orders. - Adjustment parameters are given to him for hypo and hyperglycemia in writing.  - his Glipizide will be discontinued, risk outweighs benefit for this patient, given severe hypoglycemia at times.  He notes he has trouble affording his medications.  Will give  him patient assistance form for [employer].  Will switch to Tresiba/Novolog  and give pen needles on that script too.  - Specific targets for  A1c; LDL, HDL, and Triglycerides were discussed with the patient.  2) Blood Pressure /Hypertension:  his blood pressure is controlled to target.   he is advised to continue his current medications as prescribed by  his PCP.  3) Lipids/Hyperlipidemia:    Review of his recent lipid panel from 08/27/23 showed uncontrolled LDL at 101 and elevated triglycerides of 177.  he is advised to continue Lipitor 20 mg daily at bedtime.  Side effects and precautions discussed with him.  4)  Weight/Diet:  his Body mass index is 38.1 kg/m.  -  clearly complicating his diabetes care.   he is a candidate for weight loss. I discussed with him the fact that loss of 5 - 10% of his  current body weight will have the most impact on his diabetes management.  Exercise, and detailed carbohydrates information provided  -  detailed on discharge instructions.  5) Chronic Care/Health Maintenance: -he is on ACEI/ARB and Statin medications and is encouraged to initiate and continue to follow up with Ophthalmology, Dentist, Podiatrist at least yearly or according to recommendations, and advised to stay away from smoking. I have recommended yearly flu vaccine and pneumonia vaccine at least every 5 years; moderate intensity exercise for up to 150 minutes weekly; and sleep for at least 7 hours a day.  - he is advised to maintain close follow up with Franchot Philippe CROME, FNP for primary care needs, as well as his other providers for optimal and coordinated care.     I spent  46  minutes in the care of the patient today including review of labs from CMP, Lipids, Thyroid Function, Hematology (current and previous including abstractions from other facilities); face-to-face time discussing  his blood glucose readings/logs, discussing hypoglycemia and hyperglycemia episodes and symptoms, medications doses, his options of short and long term treatment based on the latest standards of care / guidelines;  discussion about incorporating lifestyle medicine;  and documenting the encounter. Risk reduction counseling performed per USPSTF guidelines to reduce obesity and cardiovascular risk factors.     Please refer to Patient Instructions for Blood Glucose  Monitoring and Insulin /Medications Dosing Guide  in media tab for additional information. Please  also refer to  Patient Self Inventory in the Media  tab for reviewed elements of pertinent patient history.  Wellington LELON Maiden participated in the discussions, expressed understanding, and voiced agreement with the above plans.  All questions were answered to his satisfaction. he is encouraged to contact clinic should he have any questions or concerns prior to his return visit.     Follow up plan: - Return in about 3 months (around 10/19/2024) for Diabetes F/U with A1c in office, No previsit labs, Bring meter and logs.   Benton Rio, [facility] [place] [facility] [address] [place], [place] [address] Phone: [phone] Fax: [phone]  07/19/2024, 2:19 PM

## 2024-07-25 ENCOUNTER — Telehealth: Payer: Self-pay | Admitting: Nurse Practitioner

## 2024-07-25 NOTE — Telephone Encounter (Signed)
 Increase Lantus  to 60 units for now.  Reach back out in a few days if sugars dont start to respond.  Continue all other meds as prescribed.

## 2024-07-25 NOTE — Telephone Encounter (Signed)
 Pt was advised of changes in the office and told to reach out if sugars do not come down

## 2024-07-25 NOTE — Telephone Encounter (Signed)
 Pt states he has been put on 40 mg of prednisone for something to do with his eye, and it will probably move to 60 mg.  He's needing to know if he's needing to adjust any of his medications.  Started prednisone Tuesday.

## 2024-09-02 LAB — LAB REPORT - SCANNED
A1c: 10.3
EGFR: 61

## 2024-10-20 ENCOUNTER — Ambulatory Visit: Admitting: Nurse Practitioner

## 2025-01-03 ENCOUNTER — Ambulatory Visit: Admitting: Nurse Practitioner

## 2025-01-03 ENCOUNTER — Encounter: Payer: Self-pay | Admitting: Nurse Practitioner

## 2025-01-03 VITALS — BP 104/62 | HR 77 | Ht 71.0 in | Wt 279.6 lb

## 2025-01-03 DIAGNOSIS — E1165 Type 2 diabetes mellitus with hyperglycemia: Secondary | ICD-10-CM

## 2025-01-03 DIAGNOSIS — E782 Mixed hyperlipidemia: Secondary | ICD-10-CM | POA: Diagnosis not present

## 2025-01-03 DIAGNOSIS — I10 Essential (primary) hypertension: Secondary | ICD-10-CM

## 2025-01-03 DIAGNOSIS — Z794 Long term (current) use of insulin: Secondary | ICD-10-CM

## 2025-01-03 LAB — POCT GLYCOSYLATED HEMOGLOBIN (HGB A1C): Hemoglobin A1C: 11.5 % — AB (ref 4.0–5.6)

## 2025-01-03 NOTE — Progress Notes (Signed)
 "                                                                        Endocrinology Follow Up Note       01/03/2025, 1:41 PM   Subjective:    Patient ID: Eddie Irwin, male    DOB: [birth year].  Eddie Irwin is being seen in follow up after being seen in consultation for management of currently uncontrolled symptomatic diabetes requested by  Franchot Philippe CROME, FNP.   Past Medical History:  Diagnosis Date   Colon polyp    Diabetes mellitus    Herniated disc    Hyperlipidemia    Joint pain     Past Surgical History:  Procedure Laterality Date   COLONOSCOPY N/A 08/21/2020   Procedure: COLONOSCOPY;  Surgeon: Mavis Anes, MD;  Location: [facility];  Service: Gastroenterology;  Laterality: N/A;    Social History   Socioeconomic History   Marital status: Married    Spouse name: Not on file   Number of children: Not on file   Years of education: Not on file   Highest education level: Not on file  Occupational History   Not on file  Tobacco Use   Smoking status: Former   Smokeless tobacco: Never  Vaping Use   Vaping status: Never Used  Substance and Sexual Activity   Alcohol use: Yes    Comment: RARE   Drug use: Never   Sexual activity: Not Currently  Other Topics Concern   Not on file  Social History Narrative   Not on file   Social Drivers of Health   Tobacco Use: Medium Risk (01/03/2025)   Patient History    Smoking Tobacco Use: Former    Smokeless Tobacco Use: Never    Passive Exposure: Not on Actuary Strain: Not on file  Food Insecurity: Not on file  Transportation Needs: Not on file  Physical Activity: Not on file  Stress: Not on file  Social Connections: Unknown (04/21/2022)   Received from [facility]   Social Network    Social Network: Not on file  Depression (PHQ2-9): Not on file  Alcohol Screen: Not on file  Housing: Not on file  Utilities: Not on file  Health Literacy: Not on file    Family History  Problem  Relation Age of Onset   Heart attack Brother 79       cocaine use   Heart attack Mother 70    Outpatient Encounter Medications as of 01/03/2025  Medication Sig   acetaminophen  (TYLENOL ) 500 MG tablet Take 1,000 mg by mouth every 6 (six) hours as needed for moderate pain or headache.   albuterol  (VENTOLIN  HFA) 108 (90 Base) MCG/ACT inhaler Inhale 1-2 puffs into the lungs every 6 (six) hours as needed for wheezing or shortness of breath.   atorvastatin  (LIPITOR) 10 MG tablet Take 10 mg by mouth at bedtime.   fenofibrate  (TRICOR ) 145 MG tablet Take 145 mg by mouth at bedtime.   fluticasone (FLONASE) 50 MCG/ACT nasal spray Place 1 spray into both nostrils daily as needed for allergies.    gabapentin  (NEURONTIN ) 600 MG tablet Take 1,200 mg by mouth 2 (two) times daily.   HUMALOG   KWIKPEN 100 UNIT/ML KwikPen Inject 0.5 mLs (50 Units total) into the skin 3 (three) times daily. (Patient taking differently: Inject 50 Units into the skin 3 (three) times daily before meals. Patient states that he is injecting 15- 45 units at least twice a day)   LANTUS  SOLOSTAR 100 UNIT/ML Solostar Pen Inject 75 Units into the skin at bedtime. (Patient taking differently: Inject 60 Units into the skin at bedtime.)   lisinopril (PRINIVIL,ZESTRIL) 10 MG tablet Take 10 mg by mouth at bedtime.   loratadine  (CLARITIN ) 10 MG tablet Take 10 mg by mouth at bedtime.   Misc Natural Products (OSTEO BI-FLEX TRIPLE STRENGTH) TABS Take 1 tablet by mouth 2 (two) times daily.   oxyCODONE  (ROXICODONE ) 15 MG immediate release tablet Take 15 mg by mouth every 4 (four) hours as needed for pain.    SYNJARDY  12.04-999 MG TABS Take 1 tablet by mouth 2 (two) times daily.   Tamsulosin  HCl (FLOMAX ) 0.4 MG CAPS Take 0.4 mg by mouth daily.   tirzepatide (MOUNJARO) 2.5 MG/0.5ML Pen Inject 2.5 mg into the skin once a week. Patient states that he is using samples. Waiting to hear from [facility].   topiramate  (TOPAMAX ) 25 MG tablet Take 25 mg by mouth  2 (two) times daily.   [DISCONTINUED] aspirin  81 MG tablet Take 81 mg by mouth daily. (Patient not taking: Reported on 01/03/2025)   [DISCONTINUED] bismuth subsalicylate (PEPTO BISMOL) 262 MG/15ML suspension Take 30 mLs by mouth every 6 (six) hours as needed for indigestion. (Patient not taking: Reported on 01/03/2025)   [DISCONTINUED] Vitamin D, Ergocalciferol, (DRISDOL) 1.25 MG (50000 UNIT) CAPS capsule Take 50,000 Units by mouth every Friday. (Patient not taking: Reported on 01/03/2025)   No facility-administered encounter medications on file as of 01/03/2025.    ALLERGIES: No Known Allergies  VACCINATION STATUS:  There is no immunization history on file for this patient.  Diabetes He presents for his follow-up diabetic visit. He has type 2 diabetes mellitus. Onset time: diagnosed at approx age of 63. His disease course has been fluctuating. Hypoglycemia symptoms include nervousness/anxiousness, sweats and tremors. Associated symptoms include fatigue and foot paresthesias. Hypoglycemia complications include nocturnal hypoglycemia. Symptoms are stable. Diabetic complications include nephropathy, peripheral neuropathy and retinopathy. (Multiple episodes of DKA in the past) Risk factors for coronary artery disease include diabetes mellitus, dyslipidemia, family history, male sex, obesity, hypertension and sedentary lifestyle. Current diabetic treatment includes intensive insulin  program and oral agent (dual therapy) (and Ozempic). He is compliant with treatment most of the time. His weight is decreasing steadily. He is following a generally unhealthy and high fat/cholesterol diet. When asked about meal planning, he reported none. He has not had a previous visit with a dietitian. He rarely participates in exercise. His home blood glucose trend is fluctuating dramatically. His breakfast blood glucose range is generally 70-90 mg/dl. His lunch blood glucose range is generally >200 mg/dl. His dinner blood  glucose range is generally >200 mg/dl. His bedtime blood glucose range is generally >200 mg/dl. His overall blood glucose range is >200 mg/dl. (He presents today with his CGM showing dramatically fluctuating glycemic profile overall. His POCT A1c today is 11.5%, increasing from last A1c of 10.3%  Analysis of his CGM shows TIR 20%, TAR 80% (56% in level 2 hyperglycemia), TBR 0% with a GMI of 9.7%.  He gets his insulin  from [facility] through his PCP, who were working to get Mounjaro for him as well but so far he has been relying on samples.) An ACE  inhibitor/angiotensin II receptor blocker is being taken. He does not see a podiatrist.Eye exam is current.    Review of systems  Constitutional: + Minimally fluctuating body weight,  current Body mass index is 39 kg/m. , no fatigue, no subjective hyperthermia, no subjective hypothermia Eyes: no blurry vision, no xerophthalmia ENT: no sore throat, no nodules palpated in throat, no dysphagia/odynophagia, no hoarseness Cardiovascular: no chest pain, no shortness of breath, no palpitations, no leg swelling Respiratory: no cough, no shortness of breath Gastrointestinal: no nausea/vomiting/diarrhea Musculoskeletal: no muscle/joint aches Skin: no rashes, no hyperemia Neurological: no tremors, no numbness, no tingling, no dizziness Psychiatric: no depression, no anxiety  Objective:     BP 104/62 (BP Location: Right Arm, Patient Position: Sitting, Cuff Size: Large)   Pulse 77   Ht 5' 11 (1.803 m)   Wt 279 lb 9.6 oz (126.8 kg)   BMI 39.00 kg/m   Wt Readings from Last 3 Encounters:  01/03/25 279 lb 9.6 oz (126.8 kg)  07/19/24 273 lb 3.2 oz (123.9 kg)  03/14/24 282 lb 9.6 oz (128.2 kg)     BP Readings from Last 3 Encounters:  01/03/25 104/62  07/19/24 112/60  12/21/20 139/68      Physical Exam- Limited  Constitutional:  Body mass index is 39 kg/m. , not in acute distress, normal state of mind Eyes:  EOMI, no exophthalmos Musculoskeletal:  no gross deformities, strength intact in all four extremities, no gross restriction of joint movements Skin:  no rashes, no hyperemia Neurological: no tremor with outstretched hands   Diabetic Foot Exam - Simple   No data filed      CMP ( most recent) CMP     Component Value Date/Time   NA 138 12/21/2020 0259   K 4.7 12/21/2020 0259   CL 103 12/21/2020 0259   CO2 20 (L) 12/21/2020 0259   GLUCOSE 238 (H) 12/21/2020 0259   BUN 21 11/11/2023 0000   CREATININE 1.2 11/11/2023 0000   CREATININE 1.15 12/21/2020 0259   CALCIUM  8.5 (L) 12/21/2020 0259   PROT 6.1 (L) 12/20/2020 0215   ALBUMIN 3.1 (L) 12/20/2020 0215   AST 19 12/20/2020 0215   ALT 33 12/20/2020 0215   ALKPHOS 54 12/20/2020 0215   BILITOT 0.9 12/20/2020 0215   EGFR 61.0 09/01/2024 0000   EGFR 69 11/11/2023 0000   GFRNONAA >60 12/21/2020 0259     Diabetic Labs (most recent): Lab Results  Component Value Date   HGBA1C 11.5 (A) 01/03/2025   HGBA1C 10 11/11/2023   HGBA1C 9.3 08/27/2023     Lipid Panel ( most recent) Lipid Panel     Component Value Date/Time   CHOL 145 12/14/2019 0154   TRIG 177 (A) 08/27/2023 0000   HDL 30 (L) 12/14/2019 0154   CHOLHDL 4.8 12/14/2019 0154   VLDL 50 (H) 12/14/2019 0154   LDLCALC 101 08/27/2023 0000      Lab Results  Component Value Date   TSH 2.08 08/27/2023           Assessment & Plan:   1) Type 2 diabetes mellitus with hyperglycemia, with long-term current use of insulin  (HCC) (Primary)  He presents today with his CGM showing dramatically fluctuating glycemic profile overall. His POCT A1c today is 11.5%, increasing from last A1c of 10.3%  Analysis of his CGM shows TIR 20%, TAR 80% (56% in level 2 hyperglycemia), TBR 0% with a GMI of 9.7%.  He gets his insulin  from [facility] through his PCP, who were working  to get Mounjaro for him as well but so far he has been relying on samples.  - Eddie Irwin has currently uncontrolled symptomatic type 2 DM since 64  years of age.   -Recent labs reviewed.  - I had a long discussion with him about the progressive nature of diabetes and the pathology behind its complications. -his diabetes is complicated by mild CKD, mild retinopathy, neuropathy and he remains at a high risk for more acute and chronic complications which include CAD, CVA, CKD, retinopathy, and neuropathy. These are all discussed in detail with him.  The following Lifestyle Medicine recommendations according to American College of Lifestyle Medicine [facility]) were discussed and offered to patient and he agrees to start the journey:  A. Whole Foods, Plant-based plate comprising of fruits and vegetables, plant-based proteins, whole-grain carbohydrates was discussed in detail with the patient.   A list for source of those nutrients were also provided to the patient.  Patient will use only water  or unsweetened tea for hydration. B.  The need to stay away from risky substances including alcohol, smoking; obtaining 7 to 9 hours of restorative sleep, at least 150 minutes of moderate intensity exercise weekly, the importance of healthy social connections,  and stress reduction techniques were discussed. C.  A full color page of  Calorie density of various food groups per pound showing examples of each food groups was provided to the patient.  - Nutritional counseling repeated/built upon at each appointment.  - The patient admits there is a room for improvement in their diet and drink choices. -  Suggestion is made for the patient to avoid simple carbohydrates from their diet including Cakes, Sweet Desserts / Pastries, Ice Cream, Soda (diet and regular), Sweet Tea, Candies, Chips, Cookies, Sweet Pastries, Store Bought Juices, Alcohol in Excess of 1-2 drinks a day, Artificial Sweeteners, Coffee Creamer, and Sugar-free Products. This will help patient to have stable blood glucose profile and potentially avoid unintended weight gain.   - I encouraged the  patient to switch to unprocessed or minimally processed complex starch and increased protein intake (animal or plant source), fruits, and vegetables.   - Patient is advised to stick to a routine mealtimes to eat 3 meals a day and avoid unnecessary snacks (to snack only to correct hypoglycemia).  - I have approached him with the following individualized plan to manage his diabetes and patient agrees:   -He is advised to continue his Lantus  60 units SQ nightly, adjust his Humalog  to 30-36 units TID with meals if glucose is above 90 and he is eating (Specific instructions on how to titrate insulin  dosage based on glucose readings given to patient in writing).  He typically eats only 2 big meals per day so this may be a better dosage for him.  He can continue Synjardy  12.04/999 mg po twice daily.  He has a few samples left of his Mounjaro 2.5 mg from his PCP.  I do not think [facility] is doing any programs to help with cost of GLP/GIP therapy.  I did advise him to reach out to the senior care center in his county and ask about medication grants which may help with costs.  -he is encouraged to continue monitoring glucose 4 times daily(using his CGM), before meals and before bed and to call the clinic if he has readings less than 70 or above 300 for 3 tests in a row.    - he is warned not to take insulin  without proper monitoring  per orders. - Adjustment parameters are given to him for hypo and hyperglycemia in writing.  - his Glipizide will be discontinued, risk outweighs benefit for this patient, given severe hypoglycemia at times.  - Specific targets for  A1c; LDL, HDL, and Triglycerides were discussed with the patient.  2) Blood Pressure /Hypertension:  his blood pressure is controlled to target.   he is advised to continue his current medications as prescribed by his PCP.  3) Lipids/Hyperlipidemia:    Review of his recent lipid panel from 08/27/23 showed uncontrolled LDL at 101 and elevated  triglycerides of 177.  he is advised to continue Lipitor 20 mg daily at bedtime.  Side effects and precautions discussed with him.  4)  Weight/Diet:  his Body mass index is 39 kg/m.  -  clearly complicating his diabetes care.   he is a candidate for weight loss. I discussed with him the fact that loss of 5 - 10% of his  current body weight will have the most impact on his diabetes management.  Exercise, and detailed carbohydrates information provided  -  detailed on discharge instructions.  5) Chronic Care/Health Maintenance: -he is on ACEI/ARB and Statin medications and is encouraged to initiate and continue to follow up with Ophthalmology, Dentist, Podiatrist at least yearly or according to recommendations, and advised to stay away from smoking. I have recommended yearly flu vaccine and pneumonia vaccine at least every 5 years; moderate intensity exercise for up to 150 minutes weekly; and sleep for at least 7 hours a day.  - he is advised to maintain close follow up with Franchot Philippe CROME, FNP for primary care needs, as well as his other providers for optimal and coordinated care.     I spent  46  minutes in the care of the patient today including review of labs from CMP, Lipids, Thyroid Function, Hematology (current and previous including abstractions from other facilities); face-to-face time discussing  his blood glucose readings/logs, discussing hypoglycemia and hyperglycemia episodes and symptoms, medications doses, his options of short and long term treatment based on the latest standards of care / guidelines;  discussion about incorporating lifestyle medicine;  and documenting the encounter. Risk reduction counseling performed per USPSTF guidelines to reduce obesity and cardiovascular risk factors.     Please refer to Patient Instructions for Blood Glucose Monitoring and Insulin /Medications Dosing Guide  in media tab for additional information. Please  also refer to  Patient Self Inventory  in the Media  tab for reviewed elements of pertinent patient history.  Eddie Irwin participated in the discussions, expressed understanding, and voiced agreement with the above plans.  All questions were answered to his satisfaction. he is encouraged to contact clinic should he have any questions or concerns prior to his return visit.    Follow up plan: - Return in about 3 months (around 04/03/2025) for Diabetes F/U with A1c in office, No previsit labs, Bring meter and logs.   Eddie Irwin, [facility] [address] [place], [place] [address] Phone: [phone] Fax: [phone]  01/03/2025, 1:41 PM    "

## 2025-04-04 ENCOUNTER — Ambulatory Visit: Admitting: Nurse Practitioner
